# Patient Record
Sex: Male | Born: 1955 | Race: White | Hispanic: No | Marital: Married | Smoking: Former smoker
Health system: Southern US, Community
[De-identification: ages and names within clinical notes are randomized; demographics above are authoritative.]

## PROBLEM LIST (undated history)

## (undated) DIAGNOSIS — G629 Polyneuropathy, unspecified: Secondary | ICD-10-CM

## (undated) DIAGNOSIS — R221 Localized swelling, mass and lump, neck: Secondary | ICD-10-CM

## (undated) DIAGNOSIS — K760 Fatty (change of) liver, not elsewhere classified: Secondary | ICD-10-CM

## (undated) DIAGNOSIS — I499 Cardiac arrhythmia, unspecified: Secondary | ICD-10-CM

## (undated) DIAGNOSIS — R600 Localized edema: Secondary | ICD-10-CM

## (undated) HISTORY — PX: HERNIA REPAIR: SHX51

## (undated) HISTORY — PX: SALIVARY GLAND SURGERY: SHX768

## (undated) HISTORY — PX: A FLUTTER ABLATION: SHX5348

## (undated) HISTORY — PX: CARDIAC CATHETERIZATION: SHX172

## (undated) HISTORY — PX: TONSILLECTOMY: SUR1361

## (undated) HISTORY — PX: HEMORROIDECTOMY: SUR656

---

## 2014-06-15 ENCOUNTER — Inpatient Hospital Stay: Payer: Self-pay | Admitting: Family Medicine

## 2014-06-15 LAB — PRO B NATRIURETIC PEPTIDE: B-TYPE NATIURETIC PEPTID: 4251 pg/mL — AB (ref 0–125)

## 2014-06-15 LAB — URINALYSIS, COMPLETE
Bacteria: NONE SEEN
Bilirubin,UR: NEGATIVE
Blood: NEGATIVE
GLUCOSE, UR: NEGATIVE mg/dL (ref 0–75)
KETONE: NEGATIVE
LEUKOCYTE ESTERASE: NEGATIVE
Nitrite: NEGATIVE
PH: 6 (ref 4.5–8.0)
Protein: NEGATIVE
RBC,UR: <1 /HPF (ref 0–5)
Specific Gravity: 1.015 (ref 1.003–1.030)
Squamous Epithelial: NONE SEEN

## 2014-06-15 LAB — CK TOTAL AND CKMB (NOT AT ARMC)
CK, TOTAL: 117 U/L
CK, TOTAL: 95 U/L
CK, Total: 100 U/L
CK-MB: 0.9 ng/mL (ref 0.5–3.6)
CK-MB: 0.9 ng/mL (ref 0.5–3.6)
CK-MB: 0.8 ng/mL (ref 0.5–3.6)

## 2014-06-15 LAB — HEMOGLOBIN A1C: HEMOGLOBIN A1C: 6.2 % (ref 4.2–6.3)

## 2014-06-15 LAB — COMPREHENSIVE METABOLIC PANEL
ANION GAP: 11 (ref 7–16)
Albumin: 3.8 g/dL (ref 3.4–5.0)
Alkaline Phosphatase: 111 U/L
BUN: 12 mg/dL (ref 7–18)
Bilirubin,Total: 0.7 mg/dL (ref 0.2–1.0)
CHLORIDE: 104 mmol/L (ref 98–107)
CREATININE: 1.3 mg/dL (ref 0.60–1.30)
Calcium, Total: 8.6 mg/dL (ref 8.5–10.1)
Co2: 23 mmol/L (ref 21–32)
EGFR (African American): >60
EGFR (Non-African Amer.): 60 — ABNORMAL LOW
Glucose: 179 mg/dL — ABNORMAL HIGH (ref 65–99)
OSMOLALITY: 280 (ref 275–301)
POTASSIUM: 3.3 mmol/L — AB (ref 3.5–5.1)
SGOT(AST): 30 U/L (ref 15–37)
SGPT (ALT): 33 U/L (ref 12–78)
SODIUM: 138 mmol/L (ref 136–145)
Total Protein: 7.6 g/dL (ref 6.4–8.2)

## 2014-06-15 LAB — CBC
HCT: 49.2 % (ref 40.0–52.0)
HGB: 16.5 g/dL (ref 13.0–18.0)
MCH: 31.2 pg (ref 26.0–34.0)
MCHC: 33.5 g/dL (ref 32.0–36.0)
MCV: 93 fL (ref 80–100)
Platelet: 233 10*3/uL (ref 150–440)
RBC: 5.29 10*6/uL (ref 4.40–5.90)
RDW: 14.2 % (ref 11.5–14.5)
WBC: 10.1 10*3/uL (ref 3.8–10.6)

## 2014-06-15 LAB — TROPONIN I
TROPONIN-I: 0.03 ng/mL
Troponin-I: 0.03 ng/mL
Troponin-I: 0.02 ng/mL

## 2014-06-16 LAB — BASIC METABOLIC PANEL
ANION GAP: 11 (ref 7–16)
BUN: 16 mg/dL (ref 7–18)
CHLORIDE: 103 mmol/L (ref 98–107)
Calcium, Total: 8.6 mg/dL (ref 8.5–10.1)
Co2: 26 mmol/L (ref 21–32)
Creatinine: 1.19 mg/dL (ref 0.60–1.30)
EGFR (Non-African Amer.): >60
GLUCOSE: 119 mg/dL — AB (ref 65–99)
Osmolality: 282 (ref 275–301)
POTASSIUM: 3.3 mmol/L — AB (ref 3.5–5.1)
Sodium: 140 mmol/L (ref 136–145)

## 2014-06-16 LAB — CBC
HCT: 42.3 % (ref 40.0–52.0)
HGB: 14 g/dL (ref 13.0–18.0)
MCH: 30.7 pg (ref 26.0–34.0)
MCHC: 33 g/dL (ref 32.0–36.0)
MCV: 93 fL (ref 80–100)
PLATELETS: 220 10*3/uL (ref 150–440)
RBC: 4.55 10*6/uL (ref 4.40–5.90)
RDW: 14 % (ref 11.5–14.5)
WBC: 6.8 10*3/uL (ref 3.8–10.6)

## 2014-06-16 LAB — PROTIME-INR
INR: 1
Prothrombin Time: 13.4 secs (ref 11.5–14.7)

## 2014-06-16 LAB — TSH: THYROID STIMULATING HORM: 4.07 u[IU]/mL

## 2014-06-17 LAB — BASIC METABOLIC PANEL
Anion Gap: 6 — ABNORMAL LOW (ref 7–16)
BUN: 16 mg/dL (ref 7–18)
CALCIUM: 8.6 mg/dL (ref 8.5–10.1)
CREATININE: 1.01 mg/dL (ref 0.60–1.30)
Chloride: 104 mmol/L (ref 98–107)
Co2: 30 mmol/L (ref 21–32)
EGFR (Non-African Amer.): >60
Glucose: 104 mg/dL — ABNORMAL HIGH (ref 65–99)
OSMOLALITY: 281 (ref 275–301)
Potassium: 3.8 mmol/L (ref 3.5–5.1)
Sodium: 140 mmol/L (ref 136–145)

## 2015-03-20 NOTE — Consult Note (Signed)
PATIENT NAME:  Mike Vasquez, Mike Vasquez MR#:  [id number] DATE OF BIRTH:  Apr 03, 1956  DATE OF CONSULTATION:  06/15/2014  CONSULTING PHYSICIAN:  Dionisio David, MD  INDICATION FOR CONSULTATION: Chest pain.   This is a 59 year old white male with a past medical history of hypertension, hyperlipidemia, who does not reside in this area, presented with chest pain and was found to be in atrial flutter with rapid ventricular response rate. I was asked to evaluate the patient. The patient denies shortness of breath, but is complaining of still tightness in the chest. Monitor shows atrial flutter.   PAST MEDICAL HISTORY: As mentioned, hyperlipidemia, hypertension.   SOCIAL HISTORY: Quit smoking 1 year ago.   FAMILY HISTORY: Positive for coronary artery disease.   PHYSICAL EXAMINATION: GENERAL: He is alert, oriented x 3, in no acute distress.  VITAL SIGNS: Blood pressure is 149/100, respirations 18. Pulse is 110. Monitor shows atrial flutter.  NECK: Revealed no JVD.  LUNGS: Clear.  HEART: Irregularly irregular pulse. Normal S1, S2. No audible murmur.  ABDOMEN: Soft, nontender, positive bowel sounds.  EXTREMITIES: No pedal edema.   EKG shows atrial flutter, 2:1 block, 141 per minute, nonspecific ST-T changes.   First set of cardiac enzymes is negative. Rest of the labs are unremarkable. BNP is high, 4251.   ASSESSMENT AND PLAN: Atrial flutter. Advised starting the patient, instead of Cardizem, on amiodarone drip. Also advised anticoagulating the patient and do an echocardiogram and a stress test.   Thank you very much for the referral.   ____________________________ Dionisio David, MD sak:dmm D: 06/15/2014 13:06:46 ET T: 06/15/2014 13:33:30 ET JOB#: [id number]  cc: Dionisio David, MD, <Dictator> Dionisio David MD ELECTRONICALLY SIGNED 07/02/2014 12:02

## 2015-03-20 NOTE — H&P (Signed)
PATIENT NAME:  Mike Vasquez, Mike Vasquez MR#:  [id number] DATE OF BIRTH:  04-Dec-1955  DATE OF ADMISSION:  06/15/2014  PRIMARY CARE PHYSICIAN: Nonfocal.   CHIEF COMPLAINT: Palpitations and low blood pressure.   HISTORY OF PRESENT ILLNESS:  A 58 year old male who says he is feeling bad since yesterday. He took his blood pressure yesterday, it was 87/87 which is very odd for him, pulse is 144. He thought his symptoms would improve throughout the day but they did not.  His pulse remains above 100 so he came here to the ER for further evaluation. In the ER, he was noted to have A. flutter, 2 to 1, with a heart rate in 140s. He was given 30 IV diltiazem total as well as 60 p.o. His heart rate did tolerate this; however, now it is back up in 140s. The patient has had some lower extremity edema and abdominal distention. As an outpatient in [place], where he is from, he was actually scheduled to get a stress test this Friday. He had an echocardiogram about a month and a half ago which he says was normal. His cardiologist has been trying to figure out why the patient has lower extremity edema and abdominal distention. He did have an outpatient sleep study evaluation but he does not have those results. He is also complaining of chest tightness radiating to the jaw.   REVIEW OF SYSTEMS:   CONSTITUTIONAL:  No fever or chills. Positive fatigue, weakness.  EYES: No blurred or double vision, glaucoma or cataracts.   HEENT:  No ear pain, hearing loss.  Positive snoring. No allergies or postnasal drip.   RESPIRATORY:  No cough, wheeze, hemoptysis, dyspnea, chronic obstructive pulmonary disease.  CARDIOVASCULAR:  Positive palpitations. Positive chest pain and chest tightness. No orthopnea. Positive lower extremity edema. No history of arrhythmia or dyspnea on exertion.  GASTROINTESTINAL: No nausea, vomiting, diarrhea. Positive abdominal distention, no melena or ulcers.  GENITOURINARY:  No dysuria or hematuria. ENDOCRINE:  No  polyuria or polydipsia. HEMATOLOGY AND LYMPHATIC:  No anemia or easy bruising SKIN:  No rash or lesions.  MUSCULOSKELETAL: No limitation of activity, no arthritis.   NEUROLOGIC: No history of cerebrovascular accident, transient ischemic attack or seizures. PSYCHIATRIC:  No history of anxiety or depression.   PAST MEDICAL HISTORY: 1.  Hypertension.  2.  Lower extremity edema and abdominal distention.   MEDICATIONS: 1.  Potassium 10 mEq daily.  2.  Omeprazole 20 mg daily.  3.  Metoprolol 25 mg daily.  4.  Bumex 1 mg t.i.d.  5.  Albuterol ipratropium q.6 hours.   ALLERGIES: No known drug allergies.   SOCIAL HISTORY:  No tobacco, very occasional alcohol. No IV drug use.   FAMILY HISTORY:  Positive for CVA and diabetes.   SURGICAL HISTORY:  Hernia repair and submaxillary abstraction.   PHYSICAL EXAMINATION: VITAL SIGNS:  Temperature 98.9, pulse 101, respirations 19, blood pressure 103/77, 96% on room air.  GENERAL: The patient is alert and oriented, in no acute distress.   HEENT:  Head is atraumatic.  Sclerae anicteric. Mucous membranes are moist. Oropharynx clear.   NECK: Supple without JVD, carotid bruits or enlarged thyroid.  CARDIOVASCULAR: Irregularly irregular without any murmurs, gallops or rubs. PMI is hard to palpate due to body habitus.  ABDOMEN: Bowel sounds are positive, distended abdomen without any rebound or guarding. Hard to appreciate organomegaly due to body habitus.  LUNGS: Clear to auscultation without crackles, rhonchi or wheezing. Normal percussion. Normal chest expansion.  EXTREMITIES: Has 2+  edema, right is greater than the left.  NEUROLOGICAL:  Cranial nerves II through XII intact. There are no focal deficits.  SKIN: Without rash or lesions.   LABORATORY, DIAGNOSTIC AND RADIOLOGICAL DATA:   1.  Sodium 138, potassium 3.3, chloride 104, bicarbonate 23, BUN 12, creatinine 1.30, glucose 179, bilirubin 0.7, alkaline phosphatase 111, ALT 33, AST 30, total protein  7.6, albumin 3.8, calcium 8.6.  2.  BNP 1451.   3.  White blood cells 10.1, hemoglobin 16.5, hematocrit 50, platelets 233.  4.  Troponin 0.03, CK 117, CPK MB 3.9.  5.  EKG atrial flutter with a 2 to 1 AV block.  6.  Chest x-ray shows no cardiopulmonary disease.   ASSESSMENT AND PLAN: A 59 year old male who is visiting from [place], who noticed yesterday he felt very bad. He had low blood pressure and his pulse was 144.  He presented today with similar symptoms along with chest tightness radiating to his jaw, found to have atrial flutter with a 2 to 1 atrioventricular block.  1.  Atrial flutter, 2 to 1 atrioventricular block which seems to be new for the patient.  The patient  does complain of chronic fatigue for a year so he may have had these symptoms ongoing for about a year. He is currently being seen by a cardiologist back in [place], is being worked up for lower extremity edema but no mention of atrial flutter. Apparently, he had an echocardiogram about a month and a half ago which he reports as normal. We do not have these records at this time so for this hospitalization the patient will need to be admitted to Critical Care Unit/stepdown as his heart rates are still in 140s and he is on diltiazem drip. His CHADSVASC score is 1 for hypertension so he has gotten aspirin 325 mg daily. I will ask cardiology, Dr. Humphrey Rolls, who is on call today, to see the patient in consultation. I have also started low-dose metoprolol to try to get him off the diltiazem drip. I will check a TSH as well as an echocardiogram as I do not suspect that we will be getting records within the next 24 hours but we have ordered for them.  2.  Lower extremity edema. This is being worked up currently as an outpatient. His right lower extremity is actually greater than left. I am going to order a Doppler to make sure he does not have a deep vein thrombosis causing the swelling and, if this is positive for deep vein thrombosis, potentially he  could even have a blood clot in his lungs causing the atrial flutter. Will also order an echocardiogram, TSH and a urinalysis to make sure he does not have any symptoms of nephrotic syndrome. I suspect that he may have underlying sleep apnea. He did have a sleep apnea test but we do not have results back.  Can empirically put him a continuous positive airway pressure machine here at night to see if that helps. It does appear that the patient is on Bumex 1 mg p.o. t.i.d., not for congestive heart failure. He says he does not have a history of congestive heart failure, but for his lower extremity edema.  I will continue this.  3.  Chest tightness with radiation to the jaw. We will go ahead and schedule a Myoview for the a.m. provided his troponins x 3 were negative. He was supposed to have an outpatient stress test this Friday anyway. I think the patient should have one while  he is in the hospital as he needs travel back to [place] and he has these symptoms which could be indicative of acute coronary syndrome.  4.  History of hypertension. Will use diltiazem and metoprolol as I said above.  5.  Elevated blood sugar. Will check a hemoglobin A1c to rule out diabetes.  6.  Hypokalemia. We will replete.  7.  CODE STATUS: The patient is full CODE STATUS.   TIME SPENT: Approximately 45 minutes.    ____________________________ Donell Beers. Benjie Karvonen, MD spm:cs D: 06/15/2014 12:00:29 ET T: 06/15/2014 15:28:50 ET JOB#: [id number]  cc: Bettejane Leavens P. Benjie Karvonen, MD, <Dictator> Donell Beers Corynn Solberg MD ELECTRONICALLY SIGNED 06/15/2014 17:03

## 2015-03-20 NOTE — Discharge Summary (Signed)
PATIENT NAME:  Mike Vasquez, Mike Vasquez MR#:  [id number] DATE OF BIRTH:  Feb 12, 1956  DATE OF ADMISSION:  06/15/2014 DATE OF DISCHARGE:  06/17/2014  PROCEDURES:  Cardiac catheterization done by Dr. Neoma Laming showing coronary artery disease, 40% mid-LAD and 30% ostial RCA with normal left ventricular ejection fraction. A 2-D echocardiogram showed left ventricular ejection fraction estimation of 40-45%. Apex, entire inferior wall, mid and apical anterior wall were abnormal.  Severely dilated left atrium, mildly dilated right atrium, mitral valve regurgitation, severe increase of left ventricular posterior wall thickness, mild tricuspid regurgitation.   Please note that this echocardiogram was done while the patient was still in atrial flutter.  Dr. Humphrey Rolls recommended that the echocardiogram be repeated within the next 30 days.   DISCHARGE DIAGNOSES:  1.  Atrial flutter.  2.  Coronary artery disease, medical management.  3.  Possible sleep apnea.  4.  Left ventricle dysfunction, again likely this is secondary to the atrial flutter, although the patient is going to be discharged on lisinopril with recommendations as far as possible congestive heart failure.   MEDICATIONS AT DISCHARGE: Bumetanide 1 mg 2 times a day, albuterol with ipratropium nebulizer solution at home, omeprazole 20 mg once daily, potassium citrate 10 mEq once a day, Xarelto 20 mg once a day, amiodarone 20 mg once a day, Percocet 7.5 mg/3.25 mg 4 times daily, lisinopril 5 mg once daily.   FOLLOWUP: With Dr. Neoma Laming on Friday. Followup with cardiology in [place] and with his PCP in [place], as well, in the next couple of weeks.   Need to follow up on results of sleep study and possibly getting CPAP. Recommendations were explained to the patient about driving and not doing prolonged trips within the next 24 hours.  If he needs to drive back to [place]  make sure not to drive more than 60 minutes at a time, make frequent stops, at least every couple of  hours.   HOSPITAL COURSE: The patient is a very nice a 59 year old gentleman who has a history of hypertension and chronic lower extremity edema.  The came to the Emergency Department on 06/15/2014 with a chief complaint of palpitations and low blood pressure. The patient was feeling very bad since the day prior to admission.  He took his blood pressure which was 87/57. His pulse was up to 144. Since the symptoms did not improve the patient went to the Emergency Department and in the Emergency Department, he was evaluated. He was found to be in atrial flutter with 2:1 heart blockage. The patient received Cardizem without any significant resolution of his problem. He was admitted to the critical care unit and received a diltiazem drip.   The patient was evaluated by Dr. Neoma Laming who put him on low dose metoprolol, started him on Xarelto and planned for cardioversion. The patient's rhythm transformed overnight and did not require cardioversion at all.  Echocardiogram was performed showing decreased ejection fraction with multiple abnormalities of the wall.  Note that this was done while the patient was still in atrial flutter. Dr. Humphrey Rolls decided to take him to the cardiac catheterization laboratory.  He did a cardiac catheterization that showed minimal coronary artery disease, as mentioned above, and the patient is discharged on Xarelto and lisinopril.   The patient will likely need to go back on aspirin and metoprolol once he is off anticoagulation. As far as his atrial flutter goes, the patient transformed his rhythm. He is going to be discharged on amiodarone 200 mg,  and as per Dr. Humphrey Rolls, he wants to be the patient to be on Xarelto 20 mg only for 1 month until he is able to recheck an echocardiogram and see if the patient really has congestive heart failure or if this was an artifact secondary to the atrial flutter that was going on  acutely. If his ejection fraction improves the patient will be able to go  home with aspirin alone.   At the moment the patient is stable and he is able to be discharged. His heart rate is in the 50s for which we are holding his metoprolol. Since his ejection fraction was decreased we are starting an ACE inhibitor, this also could be stopped if the patient has a better ejection fraction in 1 month. The patient states that he has had ACE inhibitors in the past without any significant side effects or complications.   Education was given about congestive heart failure.   The patient also has high blood pressure which has been well controlled during this hospitalization.   It seems that most of these issues were triggered due to his possible sleep apnea. He was given CPAP here in the hospital and he tolerated it very well, but now he needs to go back to his primary care physician and see the results.   DISPOSITION:  The patient is discharged in good condition.   TIME SPENT: I spent about 45 minutes discharging this patient.   FOLLOWUP:  Dr. Neoma Laming and his cardiologist in [place].    ____________________________ [place] Sink, MD rsg:lt D: 06/17/2014 10:33:48 ET T: 06/17/2014 11:58:25 ET JOB#: [id number]  cc: [place] Sink, MD, <Dictator> Treson Laura America Brown MD ELECTRONICALLY SIGNED 06/23/2014 0:27

## 2015-03-20 NOTE — Discharge Summary (Signed)
PATIENT NAME:  Mike Vasquez, Mike Vasquez MR#:  [id number] DATE OF BIRTH:  1956-09-22  DATE OF ADMISSION:  06/15/2014 DATE OF DISCHARGE:    ADDENDUM:  Change of medications on discharge summary. Actually the lisinopril is going to be changed to Cozaar 25 mg daily. The patient actually remember that at some point with lisinopril, he started  coughing and coughing, and he was taken off it.  We are just going to change it to Cozaar.   ____________________________ [place] Sink, MD rsg:ts D: 06/17/2014 10:38:08 ET T: 06/17/2014 12:14:33 ET JOB#: [id number]  cc: [place] Sink, MD, <Dictator> Tannar Broker America Brown MD ELECTRONICALLY SIGNED 06/23/2014 0:27

## 2016-01-16 IMAGING — US US EXTREM LOW VENOUS*R*
1 series · 13 of 24 positions shown · non-contrast
Comparison: None.

CLINICAL DATA: Right leg swelling



[Series 1: us extrem low venous*right* · 0.08mm/px · 13 of 34 slices shown]
[im 1/34]
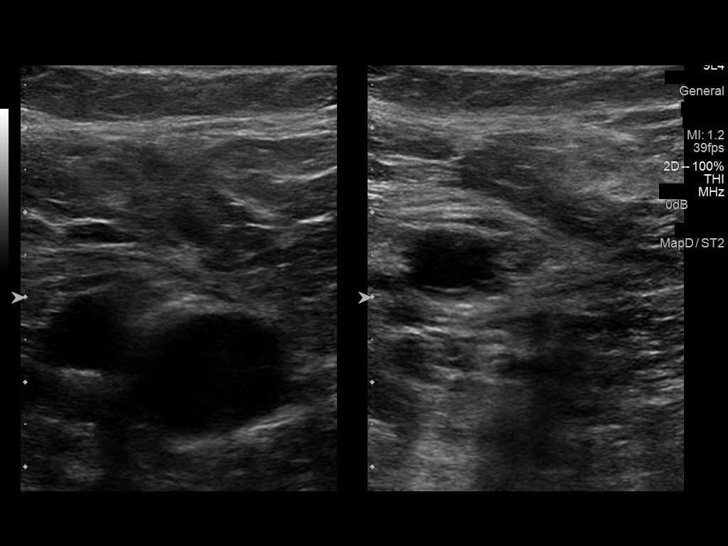
[im 3/34]
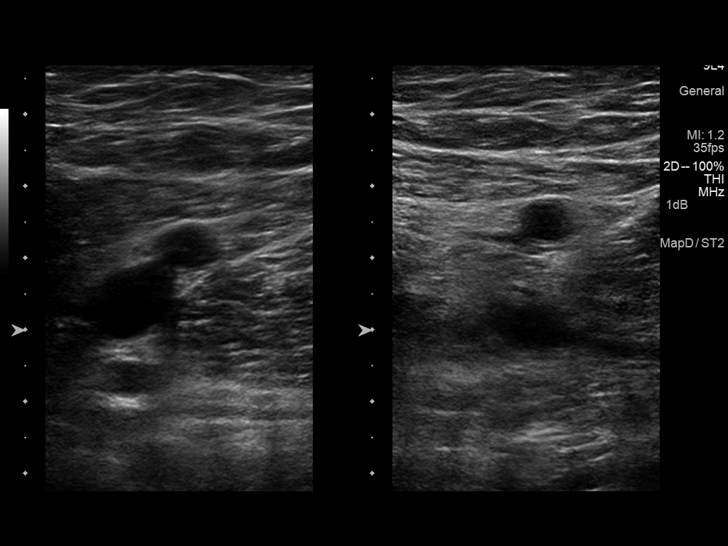
[im 6/34]
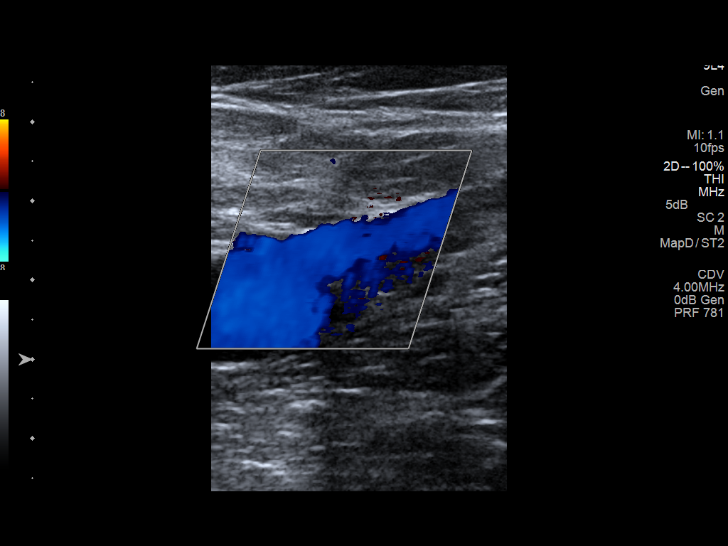
[im 9/34]
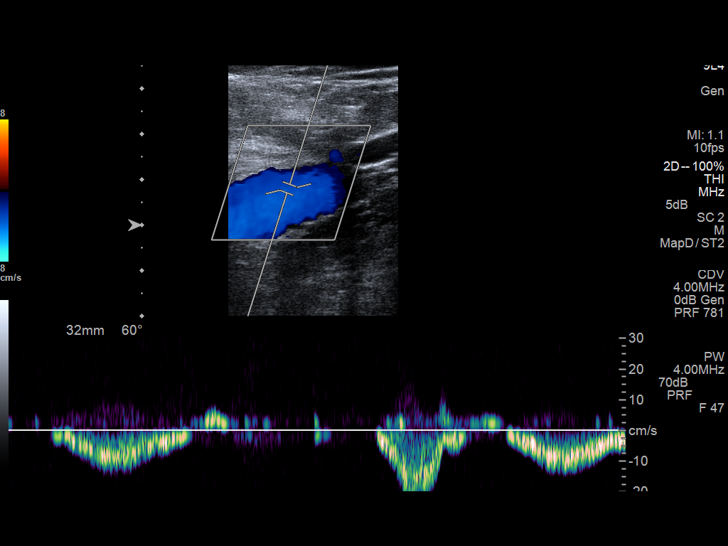
[im 12/34]
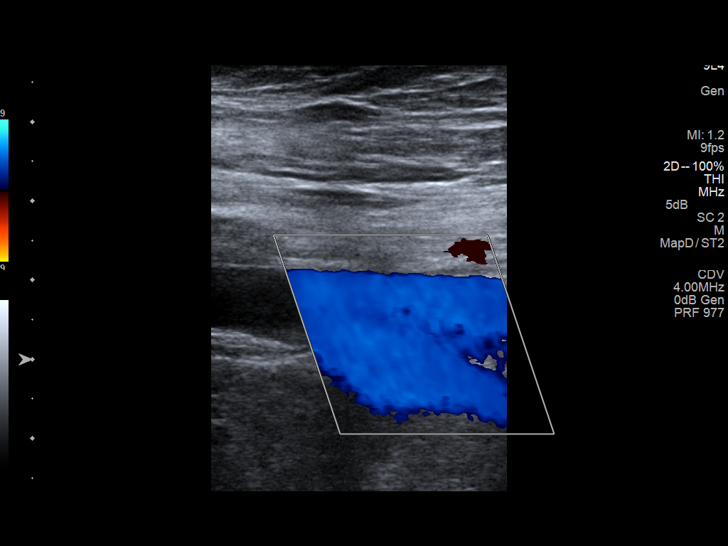
[im 15/34]
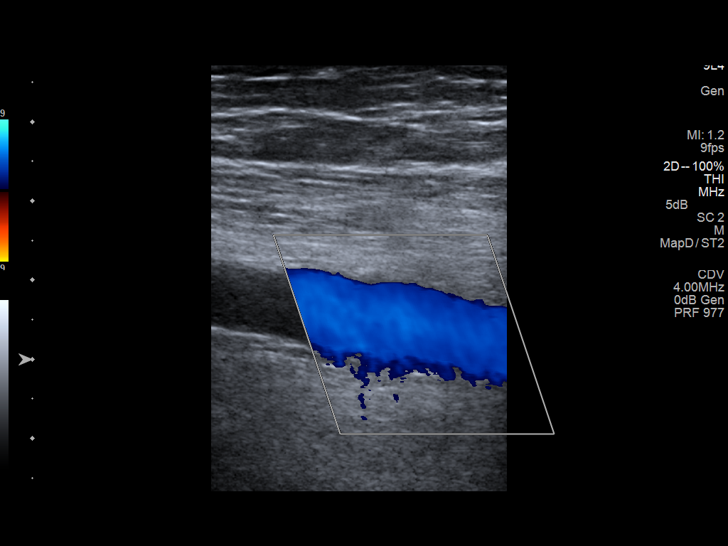
[im 18/34]
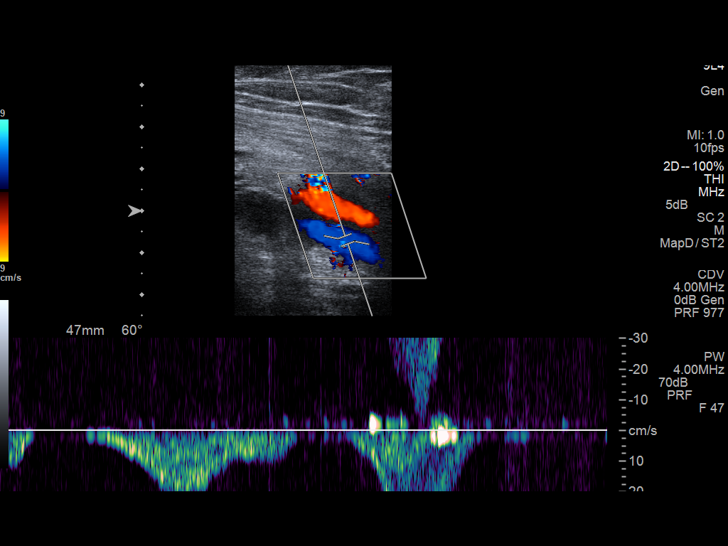
[im 19/34]
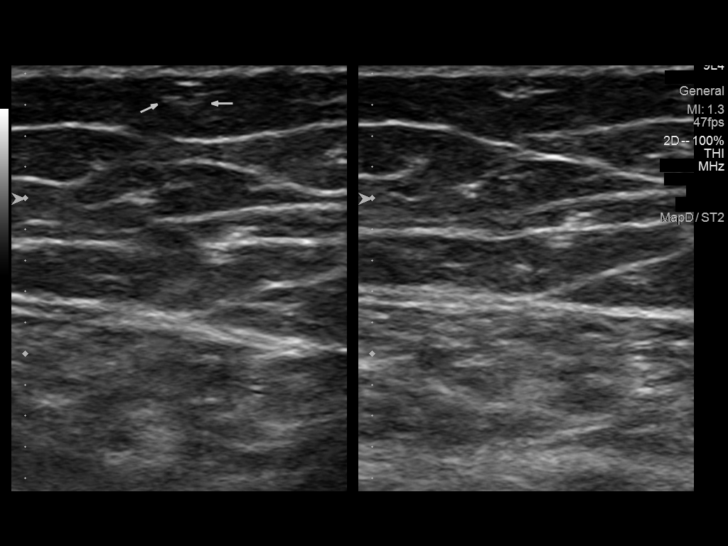
[im 22/34]
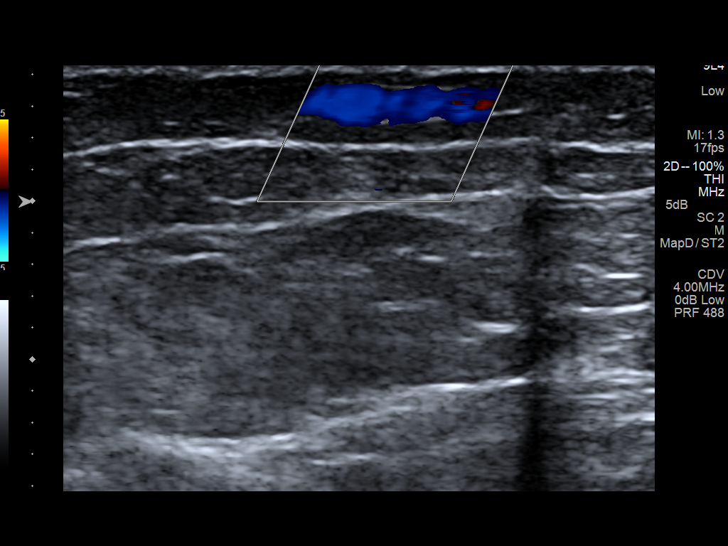
[im 25/34]
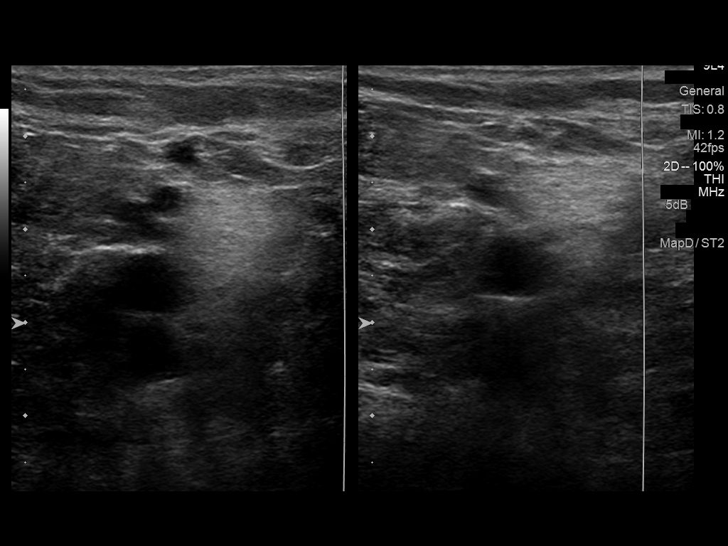
[im 28/34]
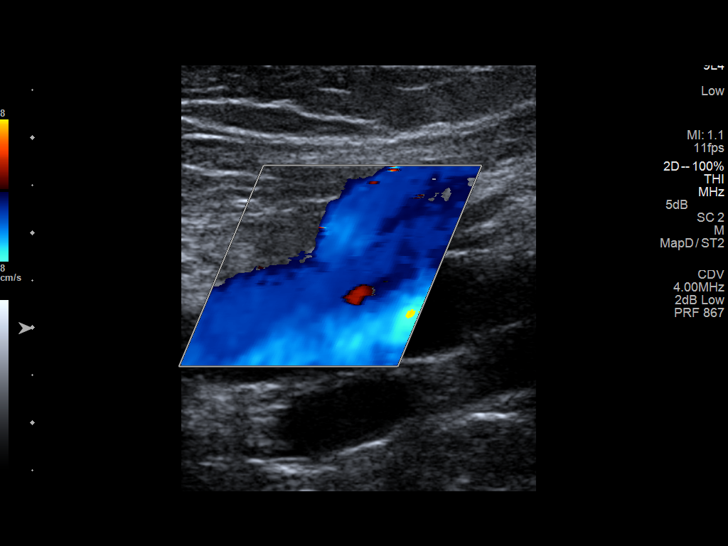
[im 31/34]
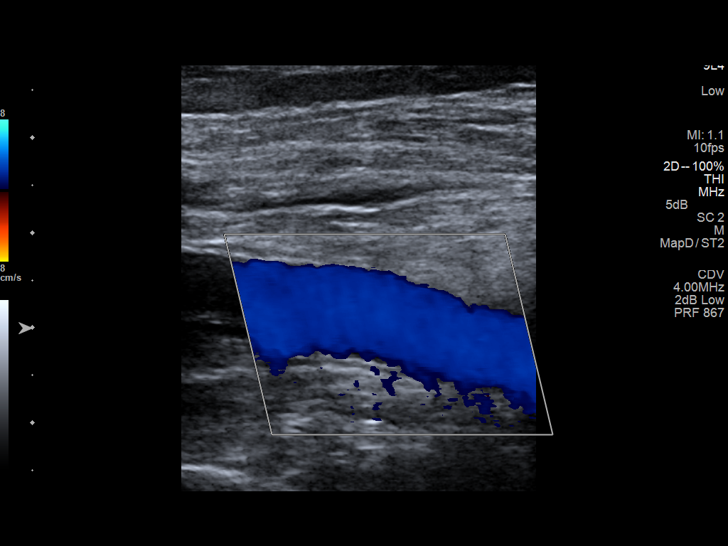
[im 34/34]
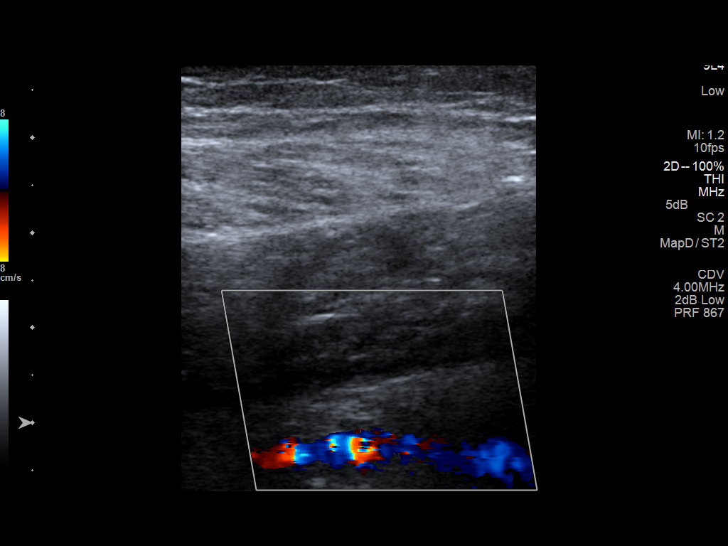

[13 of 24 positions shown; findings below may reference images not displayed]

FINDINGS: Common Femoral Vein: No evidence of thrombus. Normal
compressibility, respiratory phasicity and response to augmentation.

Saphenofemoral Junction: No evidence of thrombus. Normal
compressibility and flow on color Doppler imaging.

Profunda Femoral Vein: No evidence of thrombus. Normal
compressibility and flow on color Doppler imaging.

Femoral Vein: No evidence of thrombus. Normal compressibility,
respiratory phasicity and response to augmentation.

Popliteal Vein: No evidence of thrombus. Normal compressibility,
respiratory phasicity and response to augmentation.

Calf Veins: No evidence of thrombus. Normal compressibility and flow
on color Doppler imaging.

Superficial Great Saphenous Vein: No evidence of thrombus. Normal
compressibility and flow on color Doppler imaging.

Venous Reflux:  None.

Other Findings:  None.
IMPRESSION: No evidence of deep venous thrombosis.

## 2019-10-28 ENCOUNTER — Other Ambulatory Visit: Payer: Self-pay

## 2019-10-28 ENCOUNTER — Encounter
Admission: RE | Admit: 2019-10-28 | Discharge: 2019-10-28 | Disposition: A | Discharge status: Home / Self Care | Payer: BC Managed Care – PPO | Source: Ambulatory Visit | Attending: Otolaryngology | Admitting: Otolaryngology

## 2019-10-28 DIAGNOSIS — Z01818 Encounter for other preprocedural examination: Secondary | ICD-10-CM | POA: Insufficient documentation

## 2019-10-28 DIAGNOSIS — I4892 Unspecified atrial flutter: Secondary | ICD-10-CM | POA: Insufficient documentation

## 2019-10-28 DIAGNOSIS — Z20828 Contact with and (suspected) exposure to other viral communicable diseases: Secondary | ICD-10-CM | POA: Insufficient documentation

## 2019-10-28 DIAGNOSIS — R001 Bradycardia, unspecified: Secondary | ICD-10-CM | POA: Insufficient documentation

## 2019-10-28 DIAGNOSIS — D440 Neoplasm of uncertain behavior of thyroid gland: Secondary | ICD-10-CM | POA: Insufficient documentation

## 2019-10-28 DIAGNOSIS — Z01810 Encounter for preprocedural cardiovascular examination: Secondary | ICD-10-CM

## 2019-10-28 HISTORY — DX: Cardiac arrhythmia, unspecified: I49.9

## 2019-10-28 HISTORY — DX: Polyneuropathy, unspecified: G62.9

## 2019-10-28 HISTORY — DX: Fatty (change of) liver, not elsewhere classified: K76.0

## 2019-10-28 HISTORY — DX: Localized swelling, mass and lump, neck: R22.1

## 2019-10-28 HISTORY — DX: Localized edema: R60.0

## 2019-10-28 LAB — CBC
HCT: 49.7 % (ref 39.0–52.0)
Hemoglobin: 16.7 g/dL (ref 13.0–17.0)
MCH: 31 pg (ref 26.0–34.0)
MCHC: 33.6 g/dL (ref 30.0–36.0)
MCV: 92.4 fL (ref 80.0–100.0)
Platelets: 221 10*3/uL (ref 150–400)
RBC: 5.38 MIL/uL (ref 4.22–5.81)
RDW: 13.3 % (ref 11.5–15.5)
WBC: 7.6 10*3/uL (ref 4.0–10.5)
nRBC: 0 % (ref 0.0–0.2)

## 2019-10-28 LAB — BASIC METABOLIC PANEL
Anion gap: 9 (ref 5–15)
BUN: 22 mg/dL (ref 8–23)
CO2: 24 mmol/L (ref 22–32)
Calcium: 9.3 mg/dL (ref 8.9–10.3)
Chloride: 107 mmol/L (ref 98–111)
Creatinine, Ser: 1.14 mg/dL (ref 0.61–1.24)
GFR calc Af Amer: >60 mL/min (ref >60)
GFR calc non Af Amer: >60 mL/min (ref >60)
Glucose, Bld: 95 mg/dL (ref 70–99)
Potassium: 3.8 mmol/L (ref 3.5–5.1)
Sodium: 140 mmol/L (ref 135–145)

## 2019-10-28 NOTE — Pre-Procedure Instructions (Signed)
Called anesthesia regarding medical history and abnormal EKG.  Cardiac clearance requested.  Dr Maisie Fus office notified and faxed a request to their office.

## 2019-10-28 NOTE — Patient Instructions (Signed)
Your procedure is scheduled on: 10/31/2019 Fri Report to Same Day Surgery 2nd floor medical mall [facility] Entrance-take elevator on left to 2nd floor.  Check in with surgery information desk.) To find out your arrival time please call [phone] between 1PM - 3PM on 10/30/2019 Thurs  Remember: Instructions that are not followed completely may result in serious medical risk, up to and including death, or upon the discretion of your surgeon and anesthesiologist your surgery may need to be rescheduled.    _x___ 1. Do not eat food after midnight the night before your procedure. You may drink clear liquids up to 2 hours before you are scheduled to arrive at the hospital for your procedure.  Do not drink clear liquids within 2 hours of your scheduled arrival to the hospital.  Clear liquids include  --Water or Apple juice without pulp  --Clear carbohydrate beverage such as ClearFast or Gatorade  --Black Coffee or Clear Tea (No milk, no creamers, do not add anything to                  the coffee or Tea Type 1 and type 2 diabetics should only drink water.   ____Ensure clear carbohydrate drink on the way to the hospital for bariatric patients  ____Ensure clear carbohydrate drink 3 hours before surgery.   No gum chewing or hard candies.     __x__ 2. No Alcohol for 24 hours before or after surgery.   __x__3. No Smoking or e-cigarettes for 24 prior to surgery.  Do not use any chewable tobacco products for at least 6 hour prior to surgery   ____  4. Bring all medications with you on the day of surgery if instructed.    __x__ 5. Notify your doctor if there is any change in your medical condition     (cold, fever, infections).    x___6. On the morning of surgery brush your teeth with toothpaste and water.  You may rinse your mouth with mouth wash if you wish.  Do not swallow any toothpaste or mouthwash.   Do not wear jewelry, make-up, hairpins, clips or nail polish.  Do not wear lotions,  powders, or perfumes. You may wear deodorant.  Do not shave 48 hours prior to surgery. Men may shave face and neck.  Do not bring valuables to the hospital.    [facility] is not responsible for any belongings or valuables.               Contacts, dentures or bridgework may not be worn into surgery.  Leave your suitcase in the car. After surgery it may be brought to your room.  For patients admitted to the hospital, discharge time is determined by your                       treatment team.  _  Patients discharged the day of surgery will not be allowed to drive home.  You will need someone to drive you home and stay with you the night of your procedure.    Please read over the following fact sheets that you were given:   [facility] Preparing for Surgery and or MRSA Information   _x___ Take anti-hypertensive listed below, cardiac, seizure, asthma,     anti-reflux and psychiatric medicines. These include:  1. fenofibrate 160 MG tablet  2.gabapentin (NEURONTIN) 300 MG capsule  3.  4.  5.  6.  ____Fleets enema or Magnesium Citrate as directed.  _x___ Use CHG Soap or sage wipes as directed on instruction sheet   ____ Use inhalers on the day of surgery and bring to hospital day of surgery  ____ Stop Metformin and Janumet 2 days prior to surgery.    ____ Take 1/2 of usual insulin dose the night before surgery and none on the morning     surgery.   _x___ Follow recommendations from Cardiologist, Pulmonologist or PCP regarding          stopping Aspirin, Coumadin, Plavix ,Eliquis, Effient, or Pradaxa, and Pletal.  X____Stop Anti-inflammatories such as Advil, Aleve, Ibuprofen, Motrin, Naproxen, Naprosyn, Goodies powders or aspirin products. OK to take Tylenol and                          Celebrex.   _x___ Stop supplements until after surgery.  But may continue Vitamin D, Vitamin B,       and multivitamin.   ____ Bring C-Pap to the hospital.

## 2019-10-29 LAB — SARS CORONAVIRUS 2 (TAT 6-24 HRS): SARS Coronavirus 2: NEGATIVE

## 2019-10-31 ENCOUNTER — Other Ambulatory Visit: Payer: Self-pay

## 2019-10-31 ENCOUNTER — Observation Stay
Admission: RE | Admit: 2019-10-31 | Discharge: 2019-11-01 | Disposition: A | Discharge status: Home / Self Care | Payer: BC Managed Care – PPO | Attending: Otolaryngology | Admitting: Otolaryngology

## 2019-10-31 ENCOUNTER — Ambulatory Visit: Payer: BC Managed Care – PPO | Admitting: Certified Registered"

## 2019-10-31 ENCOUNTER — Encounter
Admission: RE | Disposition: A | Discharge status: Home / Self Care | Payer: Self-pay | Source: Home / Self Care | Attending: Otolaryngology

## 2019-10-31 DIAGNOSIS — Z87891 Personal history of nicotine dependence: Secondary | ICD-10-CM | POA: Insufficient documentation

## 2019-10-31 DIAGNOSIS — Z888 Allergy status to other drugs, medicaments and biological substances status: Secondary | ICD-10-CM | POA: Insufficient documentation

## 2019-10-31 DIAGNOSIS — E890 Postprocedural hypothyroidism: Secondary | ICD-10-CM

## 2019-10-31 DIAGNOSIS — I509 Heart failure, unspecified: Secondary | ICD-10-CM | POA: Diagnosis not present

## 2019-10-31 DIAGNOSIS — I495 Sick sinus syndrome: Secondary | ICD-10-CM | POA: Insufficient documentation

## 2019-10-31 DIAGNOSIS — C73 Malignant neoplasm of thyroid gland: Principal | ICD-10-CM | POA: Insufficient documentation

## 2019-10-31 DIAGNOSIS — Z79899 Other long term (current) drug therapy: Secondary | ICD-10-CM | POA: Diagnosis not present

## 2019-10-31 DIAGNOSIS — E785 Hyperlipidemia, unspecified: Secondary | ICD-10-CM | POA: Insufficient documentation

## 2019-10-31 DIAGNOSIS — G629 Polyneuropathy, unspecified: Secondary | ICD-10-CM | POA: Insufficient documentation

## 2019-10-31 DIAGNOSIS — Z791 Long term (current) use of non-steroidal anti-inflammatories (NSAID): Secondary | ICD-10-CM | POA: Diagnosis not present

## 2019-10-31 DIAGNOSIS — E041 Nontoxic single thyroid nodule: Secondary | ICD-10-CM | POA: Diagnosis present

## 2019-10-31 DIAGNOSIS — Z9889 Other specified postprocedural states: Secondary | ICD-10-CM

## 2019-10-31 HISTORY — PX: THYROIDECTOMY: SHX17

## 2019-10-31 SURGERY — THYROIDECTOMY
Anesthesia: General | Laterality: Bilateral

## 2019-10-31 MED ORDER — ACETAMINOPHEN 10 MG/ML IV SOLN
INTRAVENOUS | Status: DC | PRN
  Administered 2019-10-31: 1000 mg via INTRAVENOUS

## 2019-10-31 MED ORDER — ACETAMINOPHEN 10 MG/ML IV SOLN
INTRAVENOUS | Status: AC
  Filled 2019-10-31: qty 100

## 2019-10-31 MED ORDER — LIDOCAINE-EPINEPHRINE (PF) 1 %-1:200000 IJ SOLN
INTRAMUSCULAR | Status: DC | PRN
  Administered 2019-10-31: 7 mL

## 2019-10-31 MED ORDER — MORPHINE SULFATE (PF) 4 MG/ML IV SOLN
3.0000 mg | INTRAVENOUS | Status: DC | PRN
  Administered 2019-10-31 (×2): 3 mg via INTRAVENOUS
  Filled 2019-10-31 (×2): qty 1

## 2019-10-31 MED ORDER — HYDROCODONE-ACETAMINOPHEN 5-325 MG PO TABS
1.0000 | ORAL_TABLET | ORAL | Status: DC | PRN
  Administered 2019-10-31: 1 via ORAL
  Administered 2019-10-31: 2 via ORAL
  Administered 2019-10-31: 1 via ORAL
  Administered 2019-11-01 (×2): 2 via ORAL
  Filled 2019-10-31 (×3): qty 2
  Filled 2019-10-31 (×2): qty 1

## 2019-10-31 MED ORDER — FENTANYL CITRATE (PF) 100 MCG/2ML IJ SOLN
INTRAMUSCULAR | Status: AC
  Filled 2019-10-31: qty 2

## 2019-10-31 MED ORDER — BISACODYL 5 MG PO TBEC
5.0000 mg | DELAYED_RELEASE_TABLET | Freq: Every day | ORAL | Status: DC | PRN
  Filled 2019-10-31: qty 1

## 2019-10-31 MED ORDER — FENTANYL CITRATE (PF) 100 MCG/2ML IJ SOLN
INTRAMUSCULAR | Status: DC | PRN
  Administered 2019-10-31 (×4): 50 ug via INTRAVENOUS

## 2019-10-31 MED ORDER — REMIFENTANIL HCL 1 MG IV SOLR
INTRAVENOUS | Status: AC
  Filled 2019-10-31: qty 1000

## 2019-10-31 MED ORDER — MAGNESIUM HYDROXIDE 400 MG/5ML PO SUSP
30.0000 mL | Freq: Every day | ORAL | Status: DC | PRN
  Filled 2019-10-31: qty 30

## 2019-10-31 MED ORDER — LIDOCAINE-EPINEPHRINE 1 %-1:100000 IJ SOLN
INTRAMUSCULAR | Status: AC
  Filled 2019-10-31: qty 1

## 2019-10-31 MED ORDER — PHENYLEPHRINE HCL (PRESSORS) 10 MG/ML IV SOLN
INTRAVENOUS | Status: DC | PRN
  Administered 2019-10-31: 100 ug via INTRAVENOUS

## 2019-10-31 MED ORDER — REMIFENTANIL HCL 1 MG IV SOLR
INTRAVENOUS | Status: DC | PRN
  Administered 2019-10-31: .15 ug/kg/min via INTRAVENOUS
  Administered 2019-10-31: 11:00:00 via INTRAVENOUS

## 2019-10-31 MED ORDER — OXYCODONE HCL 5 MG/5ML PO SOLN
5.0000 mg | Freq: Once | ORAL | Status: AC | PRN
  Administered 2019-10-31: 13:00:00 5 mg via ORAL

## 2019-10-31 MED ORDER — EPHEDRINE SULFATE 50 MG/ML IJ SOLN
INTRAMUSCULAR | Status: DC | PRN
  Administered 2019-10-31: 10 mg via INTRAVENOUS
  Administered 2019-10-31 (×5): 5 mg via INTRAVENOUS
  Administered 2019-10-31: 10 mg via INTRAVENOUS

## 2019-10-31 MED ORDER — DEXAMETHASONE SODIUM PHOSPHATE 10 MG/ML IJ SOLN
INTRAMUSCULAR | Status: DC | PRN
  Administered 2019-10-31: 20 mg via INTRAVENOUS

## 2019-10-31 MED ORDER — FENTANYL CITRATE (PF) 100 MCG/2ML IJ SOLN
INTRAMUSCULAR | Status: AC
  Administered 2019-10-31: 12:00:00 25 ug via INTRAVENOUS
  Filled 2019-10-31: qty 2

## 2019-10-31 MED ORDER — OXYCODONE HCL 5 MG/5ML PO SOLN
ORAL | Status: AC
  Administered 2019-10-31: 13:00:00 5 mg via ORAL
  Filled 2019-10-31: qty 5

## 2019-10-31 MED ORDER — FAMOTIDINE 20 MG PO TABS
20.0000 mg | ORAL_TABLET | Freq: Once | ORAL | Status: AC
  Administered 2019-10-31: 08:00:00 20 mg via ORAL

## 2019-10-31 MED ORDER — GLYCOPYRROLATE 0.2 MG/ML IJ SOLN
INTRAMUSCULAR | Status: DC | PRN
  Administered 2019-10-31 (×2): 0.2 mg via INTRAVENOUS

## 2019-10-31 MED ORDER — FLEET ENEMA 7-19 GM/118ML RE ENEM: 1.0000 | ENEMA | Freq: Once | RECTAL | Status: DC | PRN

## 2019-10-31 MED ORDER — CALCIUM CARBONATE-VITAMIN D 500-200 MG-UNIT PO TABS
1.0000 | ORAL_TABLET | Freq: Three times a day (TID) | ORAL | Status: DC
  Administered 2019-10-31: 1 via ORAL
  Filled 2019-10-31 (×2): qty 1

## 2019-10-31 MED ORDER — OXYCODONE HCL 5 MG PO TABS: 5.0000 mg | ORAL_TABLET | Freq: Once | ORAL | Status: AC | PRN

## 2019-10-31 MED ORDER — PROPOFOL 10 MG/ML IV BOLUS
INTRAVENOUS | Status: DC | PRN
  Administered 2019-10-31: 150 mg via INTRAVENOUS

## 2019-10-31 MED ORDER — LACTATED RINGERS IV SOLN
INTRAVENOUS | Status: DC | PRN
  Administered 2019-10-31 (×2): via INTRAVENOUS

## 2019-10-31 MED ORDER — OXYCODONE HCL 5 MG/5ML PO SOLN
5.0000 mg | Freq: Once | ORAL | Status: AC
  Administered 2019-10-31: 14:00:00 5 mg via ORAL

## 2019-10-31 MED ORDER — MIDAZOLAM HCL 2 MG/2ML IJ SOLN
INTRAMUSCULAR | Status: AC
  Filled 2019-10-31: qty 2

## 2019-10-31 MED ORDER — LACTATED RINGERS IV SOLN
INTRAVENOUS | Status: DC
  Administered 2019-10-31: 20 mL/h via INTRAVENOUS

## 2019-10-31 MED ORDER — MEPERIDINE HCL 50 MG/ML IJ SOLN: 6.2500 mg | INTRAMUSCULAR | Status: DC | PRN

## 2019-10-31 MED ORDER — OXYCODONE HCL 5 MG PO TABS: 5.0000 mg | ORAL_TABLET | Freq: Once | ORAL | Status: DC

## 2019-10-31 MED ORDER — FENTANYL CITRATE (PF) 100 MCG/2ML IJ SOLN
INTRAMUSCULAR | Status: AC
  Administered 2019-10-31: 25 ug via INTRAVENOUS
  Filled 2019-10-31: qty 2

## 2019-10-31 MED ORDER — SUCCINYLCHOLINE CHLORIDE 20 MG/ML IJ SOLN
INTRAMUSCULAR | Status: DC | PRN
  Administered 2019-10-31: 120 mg via INTRAVENOUS

## 2019-10-31 MED ORDER — LIDOCAINE HCL (CARDIAC) PF 100 MG/5ML IV SOSY
PREFILLED_SYRINGE | INTRAVENOUS | Status: DC | PRN
  Administered 2019-10-31: 100 mg via INTRAVENOUS

## 2019-10-31 MED ORDER — OXYCODONE HCL 5 MG/5ML PO SOLN
ORAL | Status: AC
  Administered 2019-10-31: 5 mg via ORAL
  Filled 2019-10-31: qty 5

## 2019-10-31 MED ORDER — DEXTROSE-NACL 5-0.45 % IV SOLN
INTRAVENOUS | Status: DC
  Administered 2019-10-31 – 2019-11-01 (×2): via INTRAVENOUS

## 2019-10-31 MED ORDER — PROMETHAZINE HCL 25 MG/ML IJ SOLN: 6.2500 mg | INTRAMUSCULAR | Status: DC | PRN

## 2019-10-31 MED ORDER — FENTANYL CITRATE (PF) 100 MCG/2ML IJ SOLN
25.0000 ug | INTRAMUSCULAR | Status: AC | PRN
  Administered 2019-10-31 (×6): 25 ug via INTRAVENOUS

## 2019-10-31 MED ORDER — ONDANSETRON HCL 4 MG/2ML IJ SOLN
4.0000 mg | Freq: Four times a day (QID) | INTRAMUSCULAR | Status: DC | PRN
  Administered 2019-10-31: 4 mg via INTRAVENOUS
  Filled 2019-10-31: qty 2

## 2019-10-31 MED ORDER — ONDANSETRON HCL 4 MG/2ML IJ SOLN
INTRAMUSCULAR | Status: DC | PRN
  Administered 2019-10-31 (×2): 4 mg via INTRAVENOUS

## 2019-10-31 MED ORDER — ONDANSETRON 4 MG PO TBDP: 4.0000 mg | ORAL_TABLET | Freq: Four times a day (QID) | ORAL | Status: DC | PRN

## 2019-10-31 MED ORDER — BACITRACIN ZINC 500 UNIT/GM EX OINT
TOPICAL_OINTMENT | CUTANEOUS | Status: AC
  Filled 2019-10-31: qty 28.35

## 2019-10-31 MED ORDER — FAMOTIDINE 20 MG PO TABS
ORAL_TABLET | ORAL | Status: AC
  Administered 2019-10-31: 08:00:00 20 mg via ORAL
  Filled 2019-10-31: qty 1

## 2019-10-31 MED ORDER — MIDAZOLAM HCL 2 MG/2ML IJ SOLN
INTRAMUSCULAR | Status: DC | PRN
  Administered 2019-10-31: 2 mg via INTRAVENOUS

## 2019-10-31 SURGICAL SUPPLY — 38 items
BLADE SURG 15 STRL LF DISP TIS (BLADE) ×2 IMPLANT
BLADE SURG 15 STRL SS (BLADE) ×4
CANISTER SUCT 1200ML W/VALVE (MISCELLANEOUS) ×3 IMPLANT
CORD BIP STRL DISP 12FT (MISCELLANEOUS) ×3 IMPLANT
COVER WAND RF STERILE (DRAPES) ×3 IMPLANT
DRAIN TLS ROUND 10FR (DRAIN) IMPLANT
DRAPE MAG INST 16X20 L/F (DRAPES) ×3 IMPLANT
DRSG TEGADERM 2-3/8X2-3/4 SM (GAUZE/BANDAGES/DRESSINGS) ×3 IMPLANT
DRSG TEGADERM 4X4.75 (GAUZE/BANDAGES/DRESSINGS) ×3 IMPLANT
DRSG TELFA 3X8 NADH (GAUZE/BANDAGES/DRESSINGS) ×3 IMPLANT
ELECT LARYNGEAL 6/7 (MISCELLANEOUS) ×3
ELECT LARYNGEAL 8/9 (MISCELLANEOUS)
ELECT REM PT RETURN 9FT ADLT (ELECTROSURGICAL) ×3
ELECTRODE LARYNGEAL 6/7 (MISCELLANEOUS) ×1 IMPLANT
ELECTRODE LARYNGEAL 8/9 (MISCELLANEOUS) IMPLANT
ELECTRODE REM PT RTRN 9FT ADLT (ELECTROSURGICAL) ×1 IMPLANT
FORCEPS JEWEL BIP 4-3/4 STR (INSTRUMENTS) ×3 IMPLANT
GAUZE 4X4 16PLY RFD (DISPOSABLE) ×3 IMPLANT
GLOVE BIO SURGEON STRL SZ7.5 (GLOVE) ×3 IMPLANT
GLOVE PROTEXIS LATEX SZ 7.5 (GLOVE) ×3 IMPLANT
GOWN STRL REUS W/ TWL LRG LVL3 (GOWN DISPOSABLE) ×3 IMPLANT
GOWN STRL REUS W/TWL LRG LVL3 (GOWN DISPOSABLE) ×6
HEMOSTAT SURGICEL 2X3 (HEMOSTASIS) ×3 IMPLANT
HOOK STAY BLUNT/RETRACTOR 5M (MISCELLANEOUS) ×3 IMPLANT
LABEL OR SOLS (LABEL) IMPLANT
NS IRRIG 500ML POUR BTL (IV SOLUTION) ×3 IMPLANT
PACK HEAD/NECK (MISCELLANEOUS) ×3 IMPLANT
PROBE NEUROSIGN BIPOL (MISCELLANEOUS) ×2 IMPLANT
PROBE NEUROSIGN BIPOLAR (MISCELLANEOUS) ×4
SHEARS HARMONIC 9CM CVD (BLADE) ×3 IMPLANT
SPONGE KITTNER 5P (MISCELLANEOUS) ×12 IMPLANT
STRAP SAFETY 5IN WIDE (MISCELLANEOUS) ×3 IMPLANT
SUT ETHILON 6 0 9-3 1X18 BLK (SUTURE) IMPLANT
SUT PROLENE 6 0 P 1 18 (SUTURE) ×3 IMPLANT
SUT SILK 2 0 (SUTURE) ×2
SUT SILK 2-0 18XBRD TIE 12 (SUTURE) ×1 IMPLANT
SUT VIC AB 4-0 RB1 18 (SUTURE) ×3 IMPLANT
SYSTEM CHEST DRAIN TLS 7FR (DRAIN) ×6 IMPLANT

## 2019-10-31 NOTE — Op Note (Signed)
10/31/2019  11:32 AM    Mike Vasquez  [id number]   Pre-Op Dx: Right thyroid nodule with high suspicion of cancer  Post-op Dx: Right thyroid nodule with high suspicion of cancer  Proc: Total thyroidectomy  Surg:  Huey Romans             assistant: Osie Cheeks  Anes:  GOT  EBL: 50 mL  Comp: None  Findings: Whitish firm nodule with in the thyroid capsule on the right mid thyroid gland.  Subcentimeter nodule at the superior pole on the right side.  No other specific nodules felt.  The nerve monitoring did not work on either side.  The nerve was found visually and tracked up to the thyroid and spared.  The nerve was tested on each side once the gland was removed and the muscle contracted well.  The superior parathyroid was found on the surface of the upper pole on both sides.  This was removed from the capsule and spared.  I did not see inferior parathyroids on either side but did not take the time to try to find them.    Procedure: The patient was brought to the operating room and placed in supine position.  He was given general anesthesia by oral endotracheal intubation.  The oral endotracheal tube was wrapped with electrodes for continuous monitoring of the recurrent laryngeal nerves.  It was placed under direct vision and it looked like it was in a good position when it was placed.  Once the monitors were hooked up there was continuous stimulation of lead II.  Could not tell if this was on the right or left laryngeal nerve.  Lead I did not show any stimulation.  When the patient was asleep the anterior neck was marked in a low skin crease about a fingers breath above the sternal notch.  The patient had a small shoulder roll placed.  The skin was injected with 1% Xylocaine with epi 1: 100,000 for infiltration around the skin and subcu at the marked incision line.  He was prepped and draped in sterile fashion.  Incision was created through the skin and subcu and then through  the platysma layer.  There is a small superior and inferior flap that was raised.  Strap muscles were divided in the midline to expose the thyroid gland.  The strap muscles were elevated over the right thyroid gland first.  The strap muscles were scarred down to the thyroid capsule and there is a lot of scarring on the side from the previous biopsy that was done.  Once we freed up the lateral border of the thyroid gland the superior pole was dissected out.  Again there is a lot of scar tissue here.  A 2-3cm white nodule was noted in the right mid thyroid gland that was not extremely firm but it was within the capsule of the gland.  The superior pole as it was freed had a parathyroid gland attached to it.  This was freed and the left in the neck attached to soft tissue.  The superior vessels were cut across with the harmonic.  The posterior thyroid seem to curl around into the tracheoesophageal groove, this was freed up and the thyroid gland was rotated medially.  It was scarred down some in its middle portion.  As it was rotated dissection was carried in the bed of the tracheoesophageal groove to find the recurrent laryngeal nerve.  The stimulator did not pick up any stimulation of the nerve.  We could visually see that this was the nerve contracted into the neck a little bit but then tracked it up to the base of the larynx.  We are able to free up the thyroid over the top of it.  Then cut across Berry's ligament and freed the attachments of the right thyroid from the anterior trachea.  This rolled the entire right thyroid off to midline.  The inferior vessels were cut across as we loosen this up.  The left side was then addressed by elevating the strap muscles over the thyroid gland.  There is no scarring on this side and the strap muscles elevated easily.  The superior pole was freed up and again a parathyroid gland was found attached to the superior pole.  This was freed and left in the thyroid bed.  The  midportion of the thyroid was rotated as well as the inferior portion.  The inferior thyroid vessels were cut across with the harmonic similar to cutting the superior thyroid vessels.  The recurrent laryngeal nerve was then found in the tracheoesophageal groove just lateral to the inferior larynx.  Again the side did not show any simulation on the nerve monitor.  We could visually tell this was the nerve retracted inferiorly and superiorly up to the larynx.  We freed up the thyroid above it and cut across Berry's ligament.  This freed up the gland to rotate and the remaining thyroid attachments were freed up so the entire specimen was removed.  A nylon suture was attached to the right superior pole for orientation.  The specimen was passed off for permanent section.  The larynx was palpated laterally and the nerve was stimulated and contraction of the laryngeal muscles could be felt.  This was done on both sides to make sure the nerve was intact and still working well.  The wound was irrigated copiously on both sides and there was no significant bleeding noted on either side.  Surgicel was placed into the wound bed and overlying the recurrent laryngeal nerve on both sides.  A 7 French TLS drain was placed each side crossing in the midline.  The strap muscles were loosely brought together with just 2 sutures of 4-0 Vicryl.  The platysma muscle was then closed using interrupted 4-0 Vicryls.  The subcu was then closed with interrupted 4-0 Vicryls.  The skin edges were held in apposition with a running locking 6-0 Prolene suture.  The drains were then hooked up to low continuous Vacutainer suction.  The wound was then covered with a small amount of bacitracin followed by Telfa and Tegaderm.  The drains were taped down and the Vacutainer's taped to his shoulder.  The patient was awakened and taken to the recovery room in satisfactory condition.  There were no operative complications.  Dispo:   To PACU to be  transferred then to the floor and observed overnight  Plan: We will make sure there is no unusual bleeding and pulled his drains in the morning.  We will watch for any signs of respiratory distress.  We will start him on calcium supplements and change Vacutainer's every 4 hours to make sure that any drainage is cleared.  Elon Alas Emireth Cockerham  10/31/2019 11:32 AM

## 2019-10-31 NOTE — Progress Notes (Signed)
Pt pain now 6/10. Dr. Randa Lynn notified. Acknowledged. Orders received.

## 2019-10-31 NOTE — Anesthesia Preprocedure Evaluation (Signed)
Anesthesia Evaluation  Patient identified by MRN, date of birth, ID band Patient awake    Reviewed: Allergy & Precautions, NPO status , Patient's Chart, lab work & pertinent test results  History of Anesthesia Complications Negative for: history of anesthetic complications  Airway Mallampati: II  TM Distance: >3 FB Neck ROM: Full    Dental  (+) Implants   Pulmonary neg pulmonary ROS, neg sleep apnea, neg COPD, former smoker,    breath sounds clear to auscultation- rhonchi (-) wheezing      Cardiovascular Exercise Tolerance: Good (-) hypertension(-) CAD, (-) Past MI, (-) Cardiac Stents and (-) CABG + dysrhythmias (hx of aflutter s/p ablation, recent episodes of bradycardia, close f/u with cardiology, no need for pacemaker yet)  Rhythm:Regular Rate:Normal - Systolic murmurs and - Diastolic murmurs    Neuro/Psych neg Seizures negative neurological ROS  negative psych ROS   GI/Hepatic negative GI ROS, Neg liver ROS,   Endo/Other  negative endocrine ROSneg diabetes  Renal/GU negative Renal ROS     Musculoskeletal negative musculoskeletal ROS (+)   Abdominal (+) - obese,   Peds  Hematology negative hematology ROS (+)   Anesthesia Other Findings Past Medical History: No date: Dysrhythmia No date: Fatty liver     Comment:  history No date: Lower extremity edema No date: Mass of thyroid region No date: Neuropathy   Reproductive/Obstetrics                             Anesthesia Physical Anesthesia Plan  ASA: II  Anesthesia Plan: General   Post-op Pain Management:    Induction: Intravenous  PONV Risk Score and Plan: 1  Airway Management Planned: Oral ETT  Additional Equipment:   Intra-op Plan:   Post-operative Plan: Extubation in OR  Informed Consent: I have reviewed the patients History and Physical, chart, labs and discussed the procedure including the risks, benefits and  alternatives for the proposed anesthesia with the patient or authorized representative who has indicated his/her understanding and acceptance.     Dental advisory given  Plan Discussed with: CRNA and Anesthesiologist  Anesthesia Plan Comments:         Anesthesia Quick Evaluation

## 2019-10-31 NOTE — Transfer of Care (Signed)
Immediate Anesthesia Transfer of Care Note  Patient: Mike Vasquez  Procedure(s) Performed: THYROIDECTOMY (Bilateral )  Patient Location: PACU  Anesthesia Type:General  Level of Consciousness: awake, drowsy and patient cooperative  Airway & Oxygen Therapy: Patient Spontanous Breathing and Patient connected to face mask oxygen  Post-op Assessment: Report given to RN and Post -op Vital signs reviewed and stable  Post vital signs: Reviewed and stable  Last Vitals:  Vitals Value Taken Time  BP 173/83 10/31/19 1135  Temp 36.4 C 10/31/19 1135  Pulse 86 10/31/19 1138  Resp 17 10/31/19 1138  SpO2 99 % 10/31/19 1138  Vitals shown include unvalidated device data.  Last Pain:  Vitals:   10/31/19 0817  TempSrc: Tympanic  PainSc: 0-No pain         Complications: No apparent anesthesia complications

## 2019-10-31 NOTE — Anesthesia Postprocedure Evaluation (Signed)
Anesthesia Post Note  Patient: Mike Vasquez  Procedure(s) Performed: THYROIDECTOMY (Bilateral )  Patient location during evaluation: PACU Anesthesia Type: General Level of consciousness: awake and alert and oriented Pain management: pain level controlled Vital Signs Assessment: post-procedure vital signs reviewed and stable Respiratory status: spontaneous breathing, nonlabored ventilation and respiratory function stable Cardiovascular status: blood pressure returned to baseline and stable Postop Assessment: no signs of nausea or vomiting Anesthetic complications: no     Last Vitals:  Vitals:   10/31/19 1416 10/31/19 1510  BP: 136/64 139/69  Pulse: 70 66  Resp: 11 18  Temp: 36.7 C 36.9 C  SpO2: 94% 95%    Last Pain:  Vitals:   10/31/19 1510  TempSrc: Oral  PainSc:                  [place]ia Francisco

## 2019-10-31 NOTE — Anesthesia Procedure Notes (Signed)
Procedure Name: Intubation Performed by: Kelton Pillar, CRNA Pre-anesthesia Checklist: Patient identified, Emergency Drugs available, Suction available and Patient being monitored Patient Re-evaluated:Patient Re-evaluated prior to induction Oxygen Delivery Method: Circle system utilized Preoxygenation: Pre-oxygenation with 100% oxygen Induction Type: IV induction Ventilation: Mask ventilation without difficulty Laryngoscope Size: McGraph and 3 Grade View: Grade I Tube type: Oral Tube size: 7.5 mm Number of attempts: 1 Airway Equipment and Method: Stylet Placement Confirmation: ETT inserted through vocal cords under direct vision,  positive ETCO2,  CO2 detector and breath sounds checked- equal and bilateral Secured at: 21 cm Tube secured with: Tape Dental Injury: Teeth and Oropharynx as per pre-operative assessment

## 2019-10-31 NOTE — Anesthesia Post-op Follow-up Note (Signed)
Anesthesia QCDR form completed.        

## 2019-10-31 NOTE — H&P (Signed)
H&P has been reviewed and patient reevaluated, no changes necessary. To be downloaded later.  

## 2019-11-01 ENCOUNTER — Encounter: Payer: Self-pay | Admitting: Otolaryngology

## 2019-11-01 DIAGNOSIS — C73 Malignant neoplasm of thyroid gland: Secondary | ICD-10-CM | POA: Diagnosis not present

## 2019-11-01 LAB — CALCIUM: Calcium: 8.7 mg/dL — ABNORMAL LOW (ref 8.9–10.3)

## 2019-11-01 MED ORDER — HYDROCODONE-ACETAMINOPHEN 5-325 MG PO TABS: 1.0000 | ORAL_TABLET | Freq: Four times a day (QID) | ORAL | 0 refills | Status: AC | PRN

## 2019-11-01 NOTE — Discharge Summary (Signed)
Physician Discharge Summary  Patient ID: JEUDY TUONG MRN: [id number] DOB/AGE: 1956-07-27 63 y.o.  Admit date: 10/31/2019 Discharge date: 11/01/2019  Admission Diagnoses: Right thyroid tumor with suspicion of cancer  Discharge Diagnoses: Right thyroid tumor with suspicion of cancer Active Problems:   S/P complete thyroidectomy   Discharged Condition: good  Hospital Course: Patient was admitted to the hospital yesterday and taken to the operating room for a complete thyroidectomy.  Patient tolerated the procedure well.  Drains were removed today.  The recurrent laryngeal nerves were spared as well as parathyroids.  His calcium level was in the normal range this morning.  His wound is dry and there is no sign of swelling.  His voice is clear and he is breathing easily.  The dressing is changed.  He will return to the office in 1 week for suture removal.  We should have the path report by that time to go over it decide upon further plans.  I have not started thyroid medication yet, will discuss that on Friday and get him started then depending on the path report.  Consults: None  Significant Diagnostic Studies: labs: Calcium 8.7  Treatments: surgery: Total thyroidectomy  Discharge Exam: Blood pressure 115/67, pulse (!) 50, temperature (!) 97.5 F (36.4 C), temperature source Oral, resp. rate 20, height 6\' 2"  (1.88 m), weight 102.1 kg, SpO2 97 %. His neck is flat and there is no sign of any bruising here.  The drains have been removed.  His voice is clear  Disposition: Discharge disposition: 01-Home or Self Care       Discharge Instructions    Call MD for:  hives   Complete by: As directed    Call MD for:  persistant nausea and vomiting   Complete by: As directed    Call MD for:  redness, tenderness, or signs of infection (pain, swelling, redness, odor or green/yellow discharge around incision site)   Complete by: As directed    Call MD for:  temperature >100.4   Complete by:  As directed    Diet general   Complete by: As directed    Increase activity slowly   Complete by: As directed      Allergies as of 11/01/2019      Reactions   Lisinopril    cough   Statins    Joint pain      Medication List    TAKE these medications   albuterol 108 (90 Base) MCG/ACT inhaler Commonly known as: VENTOLIN HFA Inhale into the lungs every 6 (six) hours as needed for wheezing or shortness of breath.   b complex vitamins tablet Take 1 tablet by mouth daily.   bumetanide 1 MG tablet Commonly known as: BUMEX Take 1 mg by mouth daily.   celecoxib 200 MG capsule Commonly known as: CELEBREX Take 200 mg by mouth 2 (two) times daily.   fenofibrate 160 MG tablet Take 160 mg by mouth daily.   folic acid Q000111Q MCG tablet Commonly known as: FOLVITE Take 400 mcg by mouth daily.   gabapentin 300 MG capsule Commonly known as: NEURONTIN Take 300 mg by mouth 3 (three) times daily.   HYDROcodone-acetaminophen 5-325 MG tablet Commonly known as: NORCO/VICODIN Take 1-2 tablets by mouth every 6 (six) hours as needed for up to 7 days for moderate pain.   testosterone cypionate 200 MG/ML injection Commonly known as: DEPOTESTOSTERONE CYPIONATE Inject 100 mg into the muscle every 7 (seven) days.   tiZANidine 4 MG tablet Commonly known  as: ZANAFLEX Take 4 mg by mouth 2 (two) times daily as needed for muscle spasms.   Vitamin D 125 MCG (5000 UT) Caps Take 5,000 Units by mouth daily.   zinc gluconate 50 MG tablet Take 50 mg by mouth daily.        SignedHuey Romans 11/01/2019, 8:48 AM

## 2019-11-04 LAB — SURGICAL PATHOLOGY

## 2019-12-02 ENCOUNTER — Other Ambulatory Visit: Payer: Self-pay | Admitting: Otolaryngology

## 2019-12-02 DIAGNOSIS — C73 Malignant neoplasm of thyroid gland: Secondary | ICD-10-CM

## 2019-12-09 ENCOUNTER — Other Ambulatory Visit: Payer: Self-pay

## 2019-12-09 ENCOUNTER — Encounter
Admission: RE | Admit: 2019-12-09 | Discharge: 2019-12-09 | Disposition: A | Discharge status: Home / Self Care | Payer: BC Managed Care – PPO | Source: Ambulatory Visit | Attending: Otolaryngology | Admitting: Otolaryngology

## 2019-12-09 DIAGNOSIS — C73 Malignant neoplasm of thyroid gland: Secondary | ICD-10-CM | POA: Diagnosis not present

## 2019-12-09 MED ORDER — THYROTROPIN ALFA 1.1 MG IM SOLR
0.9000 mg | INTRAMUSCULAR | Status: AC
  Administered 2019-12-09: 0.9 mg via INTRAMUSCULAR
  Filled 2019-12-09: qty 0.9

## 2019-12-10 ENCOUNTER — Encounter
Admission: RE | Admit: 2019-12-10 | Discharge: 2019-12-10 | Disposition: A | Discharge status: Home / Self Care | Payer: BC Managed Care – PPO | Source: Ambulatory Visit | Attending: Otolaryngology | Admitting: Otolaryngology

## 2019-12-10 DIAGNOSIS — C73 Malignant neoplasm of thyroid gland: Secondary | ICD-10-CM | POA: Insufficient documentation

## 2019-12-10 MED ORDER — THYROTROPIN ALFA 1.1 MG IM SOLR
0.9000 mg | INTRAMUSCULAR | Status: AC
  Administered 2019-12-10: 0.9 mg via INTRAMUSCULAR
  Filled 2019-12-10: qty 0.9

## 2019-12-11 ENCOUNTER — Encounter
Admission: RE | Admit: 2019-12-11 | Discharge: 2019-12-11 | Disposition: A | Discharge status: Home / Self Care | Payer: BC Managed Care – PPO | Source: Ambulatory Visit | Attending: Otolaryngology | Admitting: Otolaryngology

## 2019-12-11 DIAGNOSIS — C73 Malignant neoplasm of thyroid gland: Secondary | ICD-10-CM | POA: Insufficient documentation

## 2019-12-11 MED ORDER — SODIUM IODIDE I 131 CAPSULE
75.1800 | Freq: Once | INTRAVENOUS | Status: AC | PRN
  Administered 2019-12-11: 75.18 via ORAL

## 2019-12-19 ENCOUNTER — Encounter
Admission: RE | Admit: 2019-12-19 | Discharge: 2019-12-19 | Disposition: A | Discharge status: Home / Self Care | Payer: BC Managed Care – PPO | Source: Ambulatory Visit | Attending: Otolaryngology | Admitting: Otolaryngology

## 2019-12-19 DIAGNOSIS — C73 Malignant neoplasm of thyroid gland: Secondary | ICD-10-CM | POA: Insufficient documentation

## 2021-07-21 IMAGING — NM NM [ID] THYROID CANCER METS SP CA TX
1 series · 2 of 2 positions shown · non-contrast
Comparison: None.

CLINICAL DATA: Papillary thyroid carcinoma. T1a N0 load
intermediate risk disease. Risk factor includes tumor capsular
12/11/1999.

EXAM:
NUCLEAR MEDICINE Z-IFI POST THERAPY WHOLE BODY SCAN
TECHNIQUE: The patient received 75.2 mCi Z-IFI sodium iodide for the treatment
of thyroid cancer within the past 10 days. The patient returns
today, and whole body scanning was performed in the anterior and
posterior projections.

[Series 1000: (id) 9 days post therapy · 2.40mm/px · 2 of 2 frames shown]
[frame 1/2]
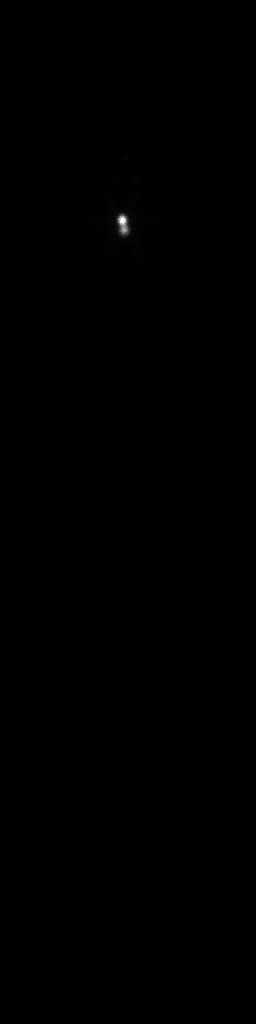
[frame 2/2]
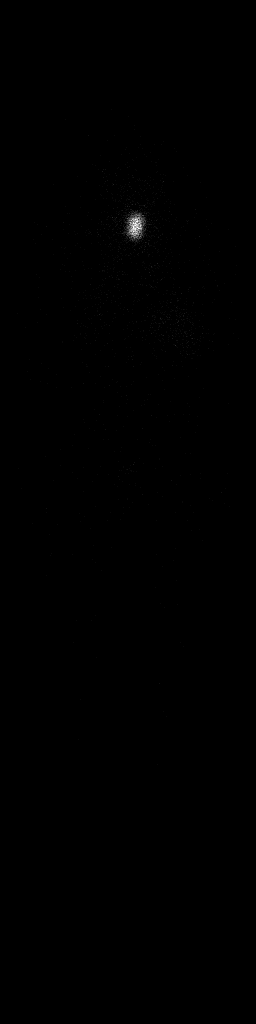

[2 of 2 positions shown; findings below may reference images not displayed]

FINDINGS: Two foci of intense radiotracer uptake within the central thyroid
bed. No evidence of metastatic disease outside of the thyroid bed.
Physiologic uptake noted in the liver.
IMPRESSION: 1. Two foci of uptake in the thyroid bed with differential of
remnant thyroid tissue versus remnant thyroid tissue and thyroid
carcinoma within the extracapsular tissue or a lymph node.
2. No evidence of distant metastatic disease.

## 2022-06-13 ENCOUNTER — Emergency Department: Payer: Medicare Other

## 2022-06-13 ENCOUNTER — Encounter: Payer: Self-pay | Admitting: Emergency Medicine

## 2022-06-13 ENCOUNTER — Observation Stay
Admission: EM | Admit: 2022-06-13 | Discharge: 2022-06-14 | Disposition: A | Discharge status: Home / Self Care | Payer: Medicare Other | Attending: Internal Medicine | Admitting: Internal Medicine

## 2022-06-13 DIAGNOSIS — Z87891 Personal history of nicotine dependence: Secondary | ICD-10-CM | POA: Diagnosis not present

## 2022-06-13 DIAGNOSIS — I2510 Atherosclerotic heart disease of native coronary artery without angina pectoris: Secondary | ICD-10-CM | POA: Insufficient documentation

## 2022-06-13 DIAGNOSIS — I110 Hypertensive heart disease with heart failure: Secondary | ICD-10-CM | POA: Insufficient documentation

## 2022-06-13 DIAGNOSIS — Z79899 Other long term (current) drug therapy: Secondary | ICD-10-CM | POA: Insufficient documentation

## 2022-06-13 DIAGNOSIS — E785 Hyperlipidemia, unspecified: Secondary | ICD-10-CM | POA: Diagnosis not present

## 2022-06-13 DIAGNOSIS — R079 Chest pain, unspecified: Principal | ICD-10-CM

## 2022-06-13 DIAGNOSIS — E876 Hypokalemia: Secondary | ICD-10-CM | POA: Diagnosis not present

## 2022-06-13 DIAGNOSIS — I5032 Chronic diastolic (congestive) heart failure: Secondary | ICD-10-CM | POA: Insufficient documentation

## 2022-06-13 DIAGNOSIS — E039 Hypothyroidism, unspecified: Secondary | ICD-10-CM | POA: Insufficient documentation

## 2022-06-13 DIAGNOSIS — I4891 Unspecified atrial fibrillation: Secondary | ICD-10-CM | POA: Diagnosis not present

## 2022-06-13 DIAGNOSIS — I10 Essential (primary) hypertension: Secondary | ICD-10-CM

## 2022-06-13 LAB — CBC
HCT: 50.3 % (ref 39.0–52.0)
Hemoglobin: 17.3 g/dL — ABNORMAL HIGH (ref 13.0–17.0)
MCH: 31.1 pg (ref 26.0–34.0)
MCHC: 34.4 g/dL (ref 30.0–36.0)
MCV: 90.5 fL (ref 80.0–100.0)
Platelets: 231 10*3/uL (ref 150–400)
RBC: 5.56 MIL/uL (ref 4.22–5.81)
RDW: 12.8 % (ref 11.5–15.5)
WBC: 6.6 10*3/uL (ref 4.0–10.5)
nRBC: 0 % (ref 0.0–0.2)

## 2022-06-13 NOTE — ED Triage Notes (Signed)
Pt presents via POV with complaints of mid-sternal CP that started around 1 hour ago. Pt also endorses a headache, left sided jaw pain, and nausea that started with the pain as well. Hx of multiple cardiac catherizations - hx of aflutter. Denies SOB.

## 2022-06-14 DIAGNOSIS — E039 Hypothyroidism, unspecified: Secondary | ICD-10-CM

## 2022-06-14 DIAGNOSIS — E876 Hypokalemia: Secondary | ICD-10-CM | POA: Diagnosis not present

## 2022-06-14 DIAGNOSIS — I10 Essential (primary) hypertension: Secondary | ICD-10-CM

## 2022-06-14 DIAGNOSIS — I5032 Chronic diastolic (congestive) heart failure: Secondary | ICD-10-CM | POA: Diagnosis not present

## 2022-06-14 DIAGNOSIS — E785 Hyperlipidemia, unspecified: Secondary | ICD-10-CM | POA: Diagnosis not present

## 2022-06-14 DIAGNOSIS — I4891 Unspecified atrial fibrillation: Secondary | ICD-10-CM | POA: Diagnosis not present

## 2022-06-14 LAB — BASIC METABOLIC PANEL
Anion gap: 7 (ref 5–15)
Anion gap: 9 (ref 5–15)
BUN: 34 mg/dL — ABNORMAL HIGH (ref 8–23)
BUN: 34 mg/dL — ABNORMAL HIGH (ref 8–23)
CO2: 22 mmol/L (ref 22–32)
CO2: 25 mmol/L (ref 22–32)
Calcium: 9 mg/dL (ref 8.9–10.3)
Calcium: 9.5 mg/dL (ref 8.9–10.3)
Chloride: 106 mmol/L (ref 98–111)
Chloride: 109 mmol/L (ref 98–111)
Creatinine, Ser: 1.31 mg/dL — ABNORMAL HIGH (ref 0.61–1.24)
Creatinine, Ser: 1.44 mg/dL — ABNORMAL HIGH (ref 0.61–1.24)
GFR, Estimated: 53 mL/min — ABNORMAL LOW (ref >60)
GFR, Estimated: 60 mL/min — ABNORMAL LOW (ref >60)
Glucose, Bld: 137 mg/dL — ABNORMAL HIGH (ref 70–99)
Glucose, Bld: 85 mg/dL (ref 70–99)
Potassium: 3.2 mmol/L — ABNORMAL LOW (ref 3.5–5.1)
Potassium: 5.1 mmol/L (ref 3.5–5.1)
Sodium: 137 mmol/L (ref 135–145)
Sodium: 141 mmol/L (ref 135–145)

## 2022-06-14 LAB — TROPONIN I (HIGH SENSITIVITY)
Troponin I (High Sensitivity): 34 ng/L — ABNORMAL HIGH (ref <18)
Troponin I (High Sensitivity): 48 ng/L — ABNORMAL HIGH (ref <18)
Troponin I (High Sensitivity): 51 ng/L — ABNORMAL HIGH (ref <18)

## 2022-06-14 LAB — CBC
HCT: 46.9 % (ref 39.0–52.0)
Hemoglobin: 15.9 g/dL (ref 13.0–17.0)
MCH: 31.5 pg (ref 26.0–34.0)
MCHC: 33.9 g/dL (ref 30.0–36.0)
MCV: 93.1 fL (ref 80.0–100.0)
Platelets: 211 10*3/uL (ref 150–400)
RBC: 5.04 MIL/uL (ref 4.22–5.81)
RDW: 13 % (ref 11.5–15.5)
WBC: 5.7 10*3/uL (ref 4.0–10.5)
nRBC: 0 % (ref 0.0–0.2)

## 2022-06-14 LAB — HIV ANTIBODY (ROUTINE TESTING W REFLEX): HIV Screen 4th Generation wRfx: NONREACTIVE

## 2022-06-14 LAB — MAGNESIUM
Magnesium: 2.4 mg/dL (ref 1.7–2.4)
Magnesium: 2.6 mg/dL — ABNORMAL HIGH (ref 1.7–2.4)

## 2022-06-14 LAB — BRAIN NATRIURETIC PEPTIDE: B Natriuretic Peptide: 41 pg/mL (ref 0.0–100.0)

## 2022-06-14 LAB — TSH: TSH: 2.578 u[IU]/mL (ref 0.350–4.500)

## 2022-06-14 MED ORDER — NITROGLYCERIN 0.4 MG SL SUBL: 0.4000 mg | SUBLINGUAL_TABLET | SUBLINGUAL | Status: DC | PRN

## 2022-06-14 MED ORDER — VITAMIN B-12 1000 MCG PO TABS
1000.0000 ug | ORAL_TABLET | Freq: Every day | ORAL | Status: DC
Start: 2022-06-14 — End: 2022-06-14
  Administered 2022-06-14: 1000 ug via ORAL
  Filled 2022-06-14: qty 1

## 2022-06-14 MED ORDER — METOPROLOL TARTRATE 25 MG PO TABS: 25.0000 mg | ORAL_TABLET | Freq: Four times a day (QID) | ORAL | Status: DC | PRN

## 2022-06-14 MED ORDER — METOPROLOL TARTRATE 25 MG PO TABS: 25.0000 mg | ORAL_TABLET | Freq: Four times a day (QID) | ORAL | 1 refills | Status: DC | PRN

## 2022-06-14 MED ORDER — VITAMIN D 25 MCG (1000 UNIT) PO TABS
5000.0000 [IU] | ORAL_TABLET | Freq: Every day | ORAL | Status: DC
  Filled 2022-06-14: qty 5

## 2022-06-14 MED ORDER — ONDANSETRON HCL 4 MG PO TABS: 4.0000 mg | ORAL_TABLET | Freq: Four times a day (QID) | ORAL | Status: DC | PRN

## 2022-06-14 MED ORDER — TESTOSTERONE CYPIONATE 200 MG/ML IM SOLN
100.0000 mg | INTRAMUSCULAR | Status: DC
Start: 2022-06-19 — End: 2022-06-14

## 2022-06-14 MED ORDER — BUMETANIDE 1 MG PO TABS
1.0000 mg | ORAL_TABLET | Freq: Two times a day (BID) | ORAL | Status: DC
  Administered 2022-06-14: 1 mg via ORAL
  Filled 2022-06-14 (×2): qty 1

## 2022-06-14 MED ORDER — ALBUTEROL SULFATE (2.5 MG/3ML) 0.083% IN NEBU: 3.0000 mL | INHALATION_SOLUTION | Freq: Four times a day (QID) | RESPIRATORY_TRACT | Status: DC | PRN

## 2022-06-14 MED ORDER — ACETAMINOPHEN 325 MG PO TABS
650.0000 mg | ORAL_TABLET | Freq: Four times a day (QID) | ORAL | Status: DC | PRN
  Administered 2022-06-14: 650 mg via ORAL
  Filled 2022-06-14: qty 2

## 2022-06-14 MED ORDER — DILTIAZEM HCL-DEXTROSE 125-5 MG/125ML-% IV SOLN (PREMIX): 5.0000 mg/h | INTRAVENOUS | Status: DC

## 2022-06-14 MED ORDER — POTASSIUM CHLORIDE IN NACL 20-0.9 MEQ/L-% IV SOLN
INTRAVENOUS | Status: DC
  Filled 2022-06-14: qty 1000

## 2022-06-14 MED ORDER — POTASSIUM CHLORIDE CRYS ER 20 MEQ PO TBCR
40.0000 meq | EXTENDED_RELEASE_TABLET | Freq: Once | ORAL | Status: AC
  Administered 2022-06-14: 40 meq via ORAL
  Filled 2022-06-14: qty 2

## 2022-06-14 MED ORDER — RIVAROXABAN 20 MG PO TABS: 20.0000 mg | ORAL_TABLET | Freq: Every day | ORAL | 1 refills | Status: DC

## 2022-06-14 MED ORDER — LEVOTHYROXINE SODIUM 50 MCG PO TABS
175.0000 ug | ORAL_TABLET | Freq: Every day | ORAL | Status: DC
  Administered 2022-06-14: 175 ug via ORAL
  Filled 2022-06-14: qty 4

## 2022-06-14 MED ORDER — METOPROLOL TARTRATE 25 MG PO TABS: 25.0000 mg | ORAL_TABLET | Freq: Four times a day (QID) | ORAL | 1 refills | Status: AC | PRN

## 2022-06-14 MED ORDER — ENOXAPARIN SODIUM 60 MG/0.6ML IJ SOSY
0.5000 mg/kg | PREFILLED_SYRINGE | INTRAMUSCULAR | Status: DC
  Administered 2022-06-14: 57.5 mg via SUBCUTANEOUS
  Filled 2022-06-14: qty 0.6

## 2022-06-14 MED ORDER — DILTIAZEM LOAD VIA INFUSION
10.0000 mg | Freq: Once | INTRAVENOUS | Status: DC
  Filled 2022-06-14: qty 10

## 2022-06-14 MED ORDER — TIZANIDINE HCL 2 MG PO TABS: 4.0000 mg | ORAL_TABLET | Freq: Two times a day (BID) | ORAL | Status: DC | PRN

## 2022-06-14 MED ORDER — ASPIRIN 81 MG PO CHEW
324.0000 mg | CHEWABLE_TABLET | Freq: Once | ORAL | Status: AC
  Administered 2022-06-14: 324 mg via ORAL
  Filled 2022-06-14: qty 4

## 2022-06-14 MED ORDER — ONDANSETRON HCL 4 MG/2ML IJ SOLN
4.0000 mg | Freq: Once | INTRAMUSCULAR | Status: AC
  Administered 2022-06-14: 4 mg via INTRAVENOUS
  Filled 2022-06-14: qty 2

## 2022-06-14 MED ORDER — ROSUVASTATIN CALCIUM 5 MG PO TABS: 5.0000 mg | ORAL_TABLET | Freq: Every day | ORAL | Status: DC

## 2022-06-14 MED ORDER — CELECOXIB 200 MG PO CAPS: 200.0000 mg | ORAL_CAPSULE | Freq: Two times a day (BID) | ORAL | Status: AC | PRN

## 2022-06-14 MED ORDER — RIVAROXABAN 20 MG PO TABS: 20.0000 mg | ORAL_TABLET | Freq: Every day | ORAL | 1 refills | Status: AC

## 2022-06-14 MED ORDER — POTASSIUM CHLORIDE 20 MEQ PO PACK
40.0000 meq | PACK | Freq: Once | ORAL | Status: AC
  Administered 2022-06-14: 40 meq via ORAL
  Filled 2022-06-14: qty 2

## 2022-06-14 MED ORDER — ONDANSETRON HCL 4 MG/2ML IJ SOLN: 4.0000 mg | Freq: Four times a day (QID) | INTRAMUSCULAR | Status: DC | PRN

## 2022-06-14 MED ORDER — MAGNESIUM HYDROXIDE 400 MG/5ML PO SUSP: 30.0000 mL | Freq: Every day | ORAL | Status: DC | PRN

## 2022-06-14 MED ORDER — SODIUM CHLORIDE 0.9 % IV SOLN: INTRAVENOUS | Status: DC

## 2022-06-14 MED ORDER — RIVAROXABAN 20 MG PO TABS: 20.0000 mg | ORAL_TABLET | Freq: Every day | ORAL | Status: DC

## 2022-06-14 MED ORDER — ACETAMINOPHEN 650 MG RE SUPP: 650.0000 mg | Freq: Four times a day (QID) | RECTAL | Status: DC | PRN

## 2022-06-14 MED ORDER — TRAZODONE HCL 50 MG PO TABS: 25.0000 mg | ORAL_TABLET | Freq: Every evening | ORAL | Status: DC | PRN

## 2022-06-14 NOTE — Care Management Obs Status (Signed)
[place] NOTIFICATION   Patient Details  Name: Mike Vasquez MRN: [id number] Date of Birth: Feb 10, 1956   Medicare Observation Status Notification Given:  Yes    Alberteen Sam, LCSW 06/14/2022, 3:33 PM

## 2022-06-14 NOTE — ED Notes (Signed)
Pt now in sinus rhythm- EKG given to provider.

## 2022-06-14 NOTE — Discharge Summary (Signed)
Physician Discharge Summary  Mike Vasquez AST:[id number] DOB: 1956-03-09 DOA: 06/13/2022  PCP: Pcp, No  Admit date: 06/13/2022 Discharge date: 06/14/2022  Time spent: 60 minutes  Recommendations for Outpatient Follow-up:  Follow-up with Dr. Clayborn Bigness, cardiology in 1 week.  On follow-up patient will need a 2D echo done and further management of paroxysmal atrial fibrillation.  Patient also need a basic metabolic profile and magnesium level checked to follow-up on electrolytes and renal function.   Discharge Diagnoses:  Principal Problem:   Atrial fibrillation with RVR (HCC) Active Problems:   Hypokalemia   Chronic diastolic CHF (congestive heart failure) (HCC)   Dyslipidemia   Hypothyroidism   Essential hypertension   Discharge Condition: Stable and improved.  Diet recommendation: Heart healthy  Filed Weights   06/13/22 2340  Weight: 113.4 kg    History of present illness:  HPI per Dr. Shelly Coss is a 66 y.o. male with medical history significant for paroxysmal atrial fibrillation status post cardiac ablation in [place], grade 1 diastolic dysfunction, hypertension, coronary artery disease, presented to the emergency room with acute onset of intermittent chest pain for the last month with associated dyspnea and palpitations.  He had a recent 20 pound weight gain.  Tonight at 10:30 PM he started having palpitations with associated chest tightness and radiation to his neck and his jaw.  He admitted to associated dyspnea and dizziness with nausea and diaphoresis without vomiting.  Since his cardiac ablation he has not been on medications for atrial fibrillation.  He had 2 previous unsuccessful cardioversions.  ED Course: Upon presentation to the emergency room BP was 139/110 and heart rate 139 with otherwise normal vital signs.  Labs revealed mild hypokalemia 3.2 and magnesium levels 2.4.  BUN and creatinine were 34/1.44 from previous normal levels.  High sensitive troponin was  34 and BNP 41.  CBC showed hemoconcentration and was otherwise unremarkable.  TSH was 2.578. EKG as reviewed by me : EKG showed atrial fibrillation with rapid ventricular sponsor of 137 with T wave inversion inferiorly and associated inferior and lateral ST segment depression and later on he spontaneously converted to normal sinus rhythm with a rate of 73 with sinus pause and prolonged PR interval and suspected left atrial enlargement Imaging: Portable chest x-ray showed mild bibasilar atelectasis.  The patient was given 40 aspirin, 4 mg of IV Zofran and 40 mill Cabbell p.o. potassium chloride.  He will be admitted to a progressive unit bed for further evaluation and management.  Hospital Course:   Assessment and Plan: * Atrial fibrillation with RVR ([place]) - This is clearly paroxysmal atrial fibrillation with RVR. - Patient admitted with A-fib with RVR and spontaneously converted to normal sinus rhythm.  -Patient monitored in the ED for recurrent A-fib.  -Electrolytes were aggressively repleted.  -Magnesium level noted normal.  -TSH noted within normal limits at 2.578.  -Cardiac enzymes minimally elevated but flat and felt likely secondary to demand ischemia in the setting of tachycardia per cardiology. -Cardiology consulted who assessed the patient and recommended patient be resumed on Xarelto 20 mg daily for stroke risk reduction and also recommended patient on metoprolol 25 mg as needed for sustained heart rates > 150, -Close outpatient follow-up with Dr. Clayborn Bigness in 1 to 2 weeks for echo and consideration for ambulatory monitoring. -Patient was cleared by cardiology for discharge home, patient also anxious to be discharged and as such patient be discharged home in stable and improved condition.  - Outpatient follow-up with cardiology.  Hypokalemia - Repleted aggressively.   -Magnesium noted within normal limits.   -Hypokalemia had resolved by day of discharge.  Essential hypertension -  Patient maintained on home regimen of antihypertensive medication.  Hypothyroidism - TSH noted within normal limits.   -Patient maintained on home regimen Synthroid.    Dyslipidemia - Patient maintained on home regimen fenofibrate and statin therapy.  Chronic diastolic CHF (congestive heart failure) (HCC) - The patient does not seem to be in acute on chronic CHF. - Patient maintained on home regimen Bumex.  -Outpatient follow-up with cardiology.        Procedures: Chest x-ray 06/14/2022   Consultations: Cardiology: Orinda Kenner, PA 06/14/2022   Discharge Exam: Vitals:   06/14/22 1230 06/14/22 1300  BP: (!) 166/61 (!) 136/56  Pulse: 64 64  Resp: 13 16  Temp:  97.9 F (36.6 C)  SpO2: 96% 93%    General: NAD Cardiovascular: Regular rate rhythm no murmurs rubs or gallops.  No JVD.  No lower extremity edema. Respiratory: Lungs clear to auscultation bilaterally.  No wheezes, no crackles, no rhonchi.  Discharge Instructions   Discharge Instructions     Diet - low sodium heart healthy   Complete by: As directed    Increase activity slowly   Complete by: As directed       Allergies as of 06/14/2022       Reactions   Lisinopril    cough   Statins    Joint pain        Medication List     STOP taking these medications    folic acid 361 MCG tablet Commonly known as: FOLVITE       TAKE these medications    albuterol 108 (90 Base) MCG/ACT inhaler Commonly known as: VENTOLIN HFA Inhale into the lungs every 6 (six) hours as needed for wheezing or shortness of breath.   bumetanide 1 MG tablet Commonly known as: BUMEX Take 1 mg by mouth 2 (two) times daily.   celecoxib 200 MG capsule Commonly known as: CELEBREX Take 1 capsule (200 mg total) by mouth 2 (two) times daily as needed for mild pain. What changed:  when to take this reasons to take this   Cholecalciferol 125 MCG (5000 UT) Tabs Take 1 tablet by mouth daily.   fenofibrate 160 MG  tablet Take 160 mg by mouth daily.   levothyroxine 175 MCG tablet Commonly known as: SYNTHROID Take 175 mcg by mouth daily.   metoprolol tartrate 25 MG tablet Commonly known as: LOPRESSOR Take 1 tablet (25 mg total) by mouth every 6 (six) hours as needed (for heart rate sustained >150 x 5 minutes).   rivaroxaban 20 MG Tabs tablet Commonly known as: XARELTO Take 1 tablet (20 mg total) by mouth daily. Start taking on: June 15, 2022   rosuvastatin 5 MG tablet Commonly known as: CRESTOR Take 5 mg by mouth at bedtime.   testosterone cypionate 200 MG/ML injection Commonly known as: DEPOTESTOSTERONE CYPIONATE Inject 100 mg into the muscle every 7 (seven) days.   tiZANidine 4 MG tablet Commonly known as: ZANAFLEX Take 4 mg by mouth 2 (two) times daily as needed for muscle spasms.   vitamin B-12 1000 MCG tablet Commonly known as: CYANOCOBALAMIN Take 1,000 mcg by mouth daily.       Allergies  Allergen Reactions   Lisinopril     cough   Statins     Joint pain    Follow-up Information     Yolonda Kida, MD.  Go in 1 week(s).   Specialties: Cardiology, Internal Medicine Contact information: [address] [place] [address] [phone]                  The results of significant diagnostics from this hospitalization (including imaging, microbiology, ancillary and laboratory) are listed below for reference.    Significant Diagnostic Studies: DG Chest Port 1 View  Result Date: 06/14/2022 CLINICAL DATA:  Chest pain. EXAM: PORTABLE CHEST 1 VIEW COMPARISON:  06/15/2014 FINDINGS: The cardiomediastinal contours are normal. Minor atelectasis at the lung bases. Pulmonary vasculature is normal. No consolidation, pleural effusion, or pneumothorax. No acute osseous abnormalities are seen. IMPRESSION: Minor bibasilar atelectasis. Electronically Signed   By: Keith Rake M.D.   On: 06/14/2022 00:03    Microbiology: No results found for this or any previous  visit (from the past 240 hour(s)).   Labs: Basic Metabolic Panel: Recent Labs  Lab 06/13/22 2347 06/14/22 0008 06/14/22 0520  NA 137  --  141  K 3.2*  --  5.1  CL 106  --  109  CO2 22  --  25  GLUCOSE 137*  --  85  BUN 34*  --  34*  CREATININE 1.44*  --  1.31*  CALCIUM 9.5  --  9.0  MG  --  2.4 2.6*   Liver Function Tests: No results for input(s): "AST", "ALT", "ALKPHOS", "BILITOT", "PROT", "ALBUMIN" in the last 168 hours. No results for input(s): "LIPASE", "AMYLASE" in the last 168 hours. No results for input(s): "AMMONIA" in the last 168 hours. CBC: Recent Labs  Lab 06/13/22 2347 06/14/22 0520  WBC 6.6 5.7  HGB 17.3* 15.9  HCT 50.3 46.9  MCV 90.5 93.1  PLT 231 211   Cardiac Enzymes: No results for input(s): "CKTOTAL", "CKMB", "CKMBINDEX", "TROPONINI" in the last 168 hours. BNP: BNP (last 3 results) Recent Labs    06/13/22 2347  BNP 41.0    ProBNP (last 3 results) No results for input(s): "PROBNP" in the last 8760 hours.  CBG: No results for input(s): "GLUCAP" in the last 168 hours.     Signed:  Irine Seal MD.  [facility] 06/14/2022, 3:24 PM

## 2022-06-14 NOTE — Care Management CC44 (Signed)
Condition Code 44 Documentation Completed  Patient Details  Name: LAQUINCY EASTRIDGE MRN: [id number] Date of Birth: 03/04/1956   Condition Code 44 given:  Yes Patient signature on Condition Code 44 notice:  Yes Documentation of 2 MD's agreement:  Yes Code 44 added to claim:  Yes    Alberteen Sam, LCSW 06/14/2022, 3:33 PM

## 2022-06-14 NOTE — H&P (Signed)
[place]   PATIENT NAME: Mike Vasquez    MR#:  [id number]  DATE OF BIRTH:  [birth year]  DATE OF ADMISSION:  06/13/2022  PRIMARY CARE PHYSICIAN: Pcp, No   Patient is coming from: Home  REQUESTING/REFERRING PHYSICIAN: Ward, Delice Bison, DO  CHIEF COMPLAINT:   Chief Complaint  Patient presents with   Chest Pain    HISTORY OF PRESENT ILLNESS:  Mike Vasquez is a 66 y.o. male with medical history significant for paroxysmal atrial fibrillation status post cardiac ablation in [place], grade 1 diastolic dysfunction, hypertension, coronary artery disease, presented to the emergency room with acute onset of intermittent chest pain for the last month with associated dyspnea and palpitations.  He had a recent 20 pound weight gain.  Tonight at 10:30 PM he started having palpitations with associated chest tightness and radiation to his neck and his jaw.  He admitted to associated dyspnea and dizziness with nausea and diaphoresis without vomiting.  Since his cardiac ablation he has not been on medications for atrial fibrillation.  He had 2 previous unsuccessful cardioversions.  ED Course: Upon presentation to the emergency room BP was 139/110 and heart rate 139 with otherwise normal vital signs.  Labs revealed mild hypokalemia 3.2 and magnesium levels 2.4.  BUN and creatinine were 34/1.44 from previous normal levels.  High sensitive troponin was 34 and BNP 41.  CBC showed hemoconcentration and was otherwise unremarkable.  TSH was 2.578. EKG as reviewed by me : EKG showed atrial fibrillation with rapid ventricular sponsor of 137 with T wave inversion inferiorly and associated inferior and lateral ST segment depression and later on he spontaneously converted to normal sinus rhythm with a rate of 73 with sinus pause and prolonged PR interval and suspected left atrial enlargement Imaging: Portable chest x-ray showed mild bibasilar atelectasis.  The patient was given 40 aspirin, 4 mg of IV Zofran and  40 mill Cabbell p.o. potassium chloride.  He will be admitted to a progressive unit bed for further evaluation and management. PAST MEDICAL HISTORY:   Past Medical History:  Diagnosis Date   Dysrhythmia    Fatty liver    history   Lower extremity edema    Mass of thyroid region    Neuropathy     PAST SURGICAL HISTORY:   Past Surgical History:  Procedure Laterality Date   A FLUTTER ABLATION     CARDIAC CATHETERIZATION     HEMORROIDECTOMY     HERNIA REPAIR     SALIVARY GLAND SURGERY     THYROIDECTOMY Bilateral 10/31/2019   Procedure: THYROIDECTOMY;  Surgeon: Margaretha Sheffield, MD;  Location: [facility] ORS;  Service: ENT;  Laterality: Bilateral;   TONSILLECTOMY      SOCIAL HISTORY:   Social History   Tobacco Use   Smoking status: Former    Types: Cigarettes    Quit date: 10/27/2012    Years since quitting: 9.6   Smokeless tobacco: Never  Substance Use Topics   Alcohol use: Never    FAMILY HISTORY:  History reviewed. No pertinent family history.  DRUG ALLERGIES:   Allergies  Allergen Reactions   Lisinopril     cough   Statins     Joint pain    REVIEW OF SYSTEMS:   ROS As per history of present illness. All pertinent systems were reviewed above. Constitutional, HEENT, cardiovascular, respiratory, GI, GU, musculoskeletal, neuro, psychiatric, endocrine, integumentary and hematologic systems were reviewed and are otherwise negative/unremarkable except for positive findings mentioned above  in the HPI.   MEDICATIONS AT HOME:   Prior to Admission medications   Medication Sig Start Date End Date Taking? Authorizing Provider  albuterol (VENTOLIN HFA) 108 (90 Base) MCG/ACT inhaler Inhale into the lungs every 6 (six) hours as needed for wheezing or shortness of breath.    [provider]  b complex vitamins tablet Take 1 tablet by mouth daily.    [provider]  bumetanide (BUMEX) 1 MG tablet Take 1 mg by mouth daily.    [provider]  celecoxib  (CELEBREX) 200 MG capsule Take 200 mg by mouth 2 (two) times daily.    [provider]  Cholecalciferol (VITAMIN D) 125 MCG (5000 UT) CAPS Take 5,000 Units by mouth daily.    [provider]  fenofibrate 160 MG tablet Take 160 mg by mouth daily.    [provider]  folic acid (FOLVITE) 811 MCG tablet Take 400 mcg by mouth daily.    [provider]  gabapentin (NEURONTIN) 300 MG capsule Take 300 mg by mouth 3 (three) times daily.    [provider]  testosterone cypionate (DEPOTESTOSTERONE CYPIONATE) 200 MG/ML injection Inject 100 mg into the muscle every 7 (seven) days. 10/08/19   [provider]  tiZANidine (ZANAFLEX) 4 MG tablet Take 4 mg by mouth 2 (two) times daily as needed for muscle spasms.    [provider]  zinc gluconate 50 MG tablet Take 50 mg by mouth daily.    [provider]      VITAL SIGNS:  Blood pressure 116/63, pulse 70, temperature 98.2 F (36.8 C), resp. rate 20, height '6\' 2"'$  (1.88 m), weight 113.4 kg, SpO2 96 %.  PHYSICAL EXAMINATION:  Physical Exam  GENERAL:  66 y.o.-year-old patient lying in the bed with no acute distress.  EYES: Pupils equal, round, reactive to light and accommodation. No scleral icterus. Extraocular muscles intact.  HEENT: Head atraumatic, normocephalic. Oropharynx and nasopharynx clear.  NECK:  Supple, no jugular venous distention. No thyroid enlargement, no tenderness.  LUNGS: Normal breath sounds bilaterally, no wheezing, rales,rhonchi or crepitation. No use of accessory muscles of respiration.  CARDIOVASCULAR: Regular rate and rhythm, S1, S2 normal. No murmurs, rubs, or gallops.  ABDOMEN: Soft, nondistended, nontender. Bowel sounds present. No organomegaly or mass.  EXTREMITIES: No pedal edema, cyanosis, or clubbing.  NEUROLOGIC: Cranial nerves II through XII are intact. Muscle strength 5/5 in all extremities. Sensation intact. Gait not checked.  PSYCHIATRIC: The patient  is alert and oriented x 3.  Normal affect and good eye contact. SKIN: No obvious rash, lesion, or ulcer.   LABORATORY PANEL:   CBC Recent Labs  Lab 06/13/22 2347  WBC 6.6  HGB 17.3*  HCT 50.3  PLT 231   ------------------------------------------------------------------------------------------------------------------  Chemistries  Recent Labs  Lab 06/13/22 2347 06/14/22 0008  NA 137  --   K 3.2*  --   CL 106  --   CO2 22  --   GLUCOSE 137*  --   BUN 34*  --   CREATININE 1.44*  --   CALCIUM 9.5  --   MG  --  2.4   ------------------------------------------------------------------------------------------------------------------  Cardiac Enzymes No results for input(s): "TROPONINI" in the last 168 hours. ------------------------------------------------------------------------------------------------------------------  RADIOLOGY:  DG Chest Port 1 View  Result Date: 06/14/2022 CLINICAL DATA:  Chest pain. EXAM: PORTABLE CHEST 1 VIEW COMPARISON:  06/15/2014 FINDINGS: The cardiomediastinal contours are normal. Minor atelectasis at the lung bases. Pulmonary vasculature is normal. No consolidation, pleural  effusion, or pneumothorax. No acute osseous abnormalities are seen. IMPRESSION: Minor bibasilar atelectasis. Electronically Signed   By: Keith Rake M.D.   On: 06/14/2022 00:03      IMPRESSION AND PLAN:  Assessment and Plan: * Atrial fibrillation with RVR ([place]) - This is clearly paroxysmal atrial fibrillation with RVR. - The patient will be admitted to a PCU bed. - We will monitor for recurrent atrial fibrillation. - We will aggressively replace potassium.  Magnesium levels normal. - Cardiology consult and 2D echo will be obtained. - I notified Dr. Clayborn Bigness about the patient.  Hypokalemia - Potassium will be aggressively replaced as this could be contributing to her paroxysms of atrial fibrillation. - Magnesium levels normal  Essential hypertension - We will  continue his antihypertensives.  Hypothyroidism - We will continue Synthroid.  Dyslipidemia - We will continue fenofibrate and statin therapy.  Chronic diastolic CHF (congestive heart failure) (HCC) - The patient does not seem to be in acute on chronic CHF. - We will continue her diuretic therapy.    DVT prophylaxis: Lovenox. Advanced Care Planning:  Code Status: full code.  Family Communication:  The plan of care was discussed in details with the patient (and family). I answered all questions. The patient agreed to proceed with the above mentioned plan. Further management will depend upon hospital course. Disposition Plan: Back to previous home environment Consults called: Cardiology.   All the records are reviewed and case discussed with ED provider.  Status is: Inpatient   At the time of the admission, it appears that the appropriate admission status for this patient is inpatient.  This is judged to be reasonable and necessary in order to provide the required intensity of service to ensure the patient's safety given the presenting symptoms, physical exam findings and initial radiographic and laboratory data in the context of comorbid conditions.  The patient requires inpatient status due to high intensity of service, high risk of further deterioration and high frequency of surveillance required.  I certify that at the time of admission, it is my clinical judgment that the patient will require inpatient hospital care extending more than 2 midnights.                            Dispo: The patient is from: Home              Anticipated d/c is to: Home              Patient currently is not medically stable to d/c.              Difficult to place patient: No  Christel Mormon M.D on 06/14/2022 at 2:48 AM  [facility]   From 7 PM-7 AM, contact night-coverage [email or web address]  CC: Primary care physician; Pcp, No

## 2022-06-14 NOTE — Consult Note (Addendum)
[place] NOTE       Patient ID: Mike Vasquez MRN: [id number] DOB/AGE: 1956-01-15 66 y.o.  Admit date: 06/13/2022 Referring Physician Dr. Irine Seal Primary Physician PCP in [facility] [place], [place]) Reason for Consultation paroxysmal AF with RVR  HPI: Mike Vasquez is a 60yoM with a PMH of paroxysmal atrial flutter s/p ablation 08/2018 after two unsuccessful DCCV, ASD/PFO with bidirectional shunting discovered on R heart cath 2016 treated medically, mild-moderate CAD by heart cath 04/2014, history of tobacco use, hyperlipidemia, hypothyroidism s/p thyroidectomy who presented to [facility] ED 06/13/2022 with chest aching and fluttering with discomfort radiating to his neck and jaw that started at 10:30 PM on 7/18.  He was found to be in atrial fibrillation with RVR on admission, converting to sinus rhythm spontaneously.  Cardiology is consulted for further assistance.  The patient presents with his wife who contributes to the history.  He and his wife are retired and live primarily an [place] which is currently being repaired in [place] where they used to live.  Their current home base is in [place] but they spend almost equal time here in [place] visiting family.  He states over the past 2 months he has noticed a 20 pound weight gain which surprised him after he was weighed at his PCPs office. He has not been weighing daily because his scale is in his RV. he has peripheral edema and possible diastolic heart failure for which  which he will take Bumex 1 mg twice a day if he noticed swelling in his legs.  He was surprised by his weight gain because his legs were not swollen, but noticed some generalized swelling in his abdomen, arms and face.  He has taken his Bumex 1 mg twice daily for the past 10 days and his weight has decreased 10 pounds.  He has not been seen by a cardiologist in person in 4 to 5 years but has had some contact with his  regular cardiologist in [place] via telephone.  He says he has been stable from an arrhythmia standpoint to his knowledge over the past few years and has been off of his antiarrhythmic, anticoagulation, and rate control medicines for a number of years as well.  He says on June 6 he received a notification overnight from his [employer] that he have a short episode of arrhythmia that he did not notice.  And yesterday around 1030pm he had a sudden onset of chest "vibrating" and achiness that radiated to his neck ,jaw, and ears that lasted until he presented to the ER and converted to sinus rhythm spontaneously.  At my time of evaluation he is calm and comfortable, in sinus rhythm on telemetry, has some residual headache, but no "vibrating" in his chest or achiness.  No orthopnea, shortness of breath, presyncope, or lower extremity edema.  He has a 30-40-pack-year history of smoking, quit 10 years ago.  Vitals are notable for a blood pressure of 166/61, heart rate 64 in sinus rhythm on telemetry, SPO2 96% on room air.  His labs are notable for initially hypokalemia to 3.2 repleted to 5.1, magnesium 2.6 BUN/creatinine 34/1.31, GFR 60.  BNP 41 high-sensitivity troponin minimally elevated and flat trending 34-51-48.  H&H stable at 15.9/46.9, platelets 211.  Chest x-ray with no pleural effusion, pneumothorax with minor bibasilar atelectasis.   Review of systems complete and found to be negative unless listed above     Past Medical History:  Diagnosis Date  Dysrhythmia    Fatty liver    history   Lower extremity edema    Mass of thyroid region    Neuropathy     Past Surgical History:  Procedure Laterality Date   A FLUTTER ABLATION     CARDIAC CATHETERIZATION     HEMORROIDECTOMY     HERNIA REPAIR     SALIVARY GLAND SURGERY     THYROIDECTOMY Bilateral 10/31/2019   Procedure: THYROIDECTOMY;  Surgeon: Margaretha Sheffield, MD;  Location: [facility] ORS;  Service: ENT;  Laterality: Bilateral;   TONSILLECTOMY       (Not in a hospital admission)  Social History   Socioeconomic History   Marital status: Married    Spouse name: Not on file   Number of children: Not on file   Years of education: Not on file   Highest education level: Not on file  Occupational History   Not on file  Tobacco Use   Smoking status: Former    Types: Cigarettes    Quit date: 10/27/2012    Years since quitting: 9.6   Smokeless tobacco: Never  Vaping Use   Vaping Use: Never used  Substance and Sexual Activity   Alcohol use: Never   Drug use: Never   Sexual activity: Not on file  Other Topics Concern   Not on file  Social History Narrative   Not on file   Social Determinants of Health   Financial Resource Strain: Not on file  Food Insecurity: Not on file  Transportation Needs: Not on file  Physical Activity: Not on file  Stress: Not on file  Social Connections: Not on file  Intimate Partner Violence: Not on file    History reviewed. No pertinent family history.    PHYSICAL EXAM General: Pleasant conversational Caucasian male, well nourished, in no acute distress.  Sitting upright in ED stretcher with wife at bedside HEENT:  Normocephalic and atraumatic. Neck:  No JVD.  Lungs: Normal respiratory effort on room air. Clear bilaterally to auscultation. No wheezes, crackles, rhonchi.  Heart: HRRR . Normal S1 and S2 without gallops or murmurs. Radial & DP pulses 2+ bilaterally. Abdomen: Obese appearing.  Msk: Normal strength and tone for age. Extremities: Warm and well perfused. No clubbing, cyanosis.  No peripheral edema.  Neuro: Alert and oriented X 3. Psych:  Answers questions appropriately.   Labs:   Lab Results  Component Value Date   WBC 5.7 06/14/2022   HGB 15.9 06/14/2022   HCT 46.9 06/14/2022   MCV 93.1 06/14/2022   PLT 211 06/14/2022    Recent Labs  Lab 06/14/22 0520  NA 141  K 5.1  CL 109  CO2 25  BUN 34*  CREATININE 1.31*  CALCIUM 9.0  GLUCOSE 85   Lab Results  Component  Value Date   CKTOTAL 95 06/15/2014   CKMB 0.8 06/15/2014   TROPONINI 0.02 06/15/2014   No results found for: "CHOL" No results found for: "HDL" No results found for: "LDLCALC" No results found for: "TRIG" No results found for: "CHOLHDL" No results found for: "LDLDIRECT"    Radiology: [facility] Chest Port 1 View  Result Date: 06/14/2022 CLINICAL DATA:  Chest pain. EXAM: PORTABLE CHEST 1 VIEW COMPARISON:  06/15/2014 FINDINGS: The cardiomediastinal contours are normal. Minor atelectasis at the lung bases. Pulmonary vasculature is normal. No consolidation, pleural effusion, or pneumothorax. No acute osseous abnormalities are seen. IMPRESSION: Minor bibasilar atelectasis. Electronically Signed   By: Keith Rake M.D.   On: 06/14/2022 00:03  ECHO 07/09/2018 Summary: TEE with [facility]  The left ventricular wall motion is normal.  No left atrial clot detected.  There is trace mitral regurgitation.   Reason for Study: I48.0 Paroxysmal atrial fibrillation ([place]).  Procedure: 48546 Complete color flow Doppler interrogation.  Transesophageal echocardiography diagnostic (27035).  Procedure Notes: After explanation of the risk and benefits, patient  consented for Transesophageal Echocardiogram. An intravenous line was  placed. A topical anesthetic agent was used for oropharangeal  anesthesia. A bite block was inserted. A multifrequency, multiplane  transesopheageal echocardiographic endoscope was inserted and  manipulated in the standard fashion to achieve multiplane views. The  usual views were obtained; basal, mid-esophageal, transgastric and  aortic views. The patient's vital signs, including blood pressure,  heart rate, pulse oximetry and cardiac rhythm were monitored  throughout the procedure and remained stable. The patient tolerated  the procedure well without evidence of orophangeal or esophageal  trauma. There were no complications.  Left Ventricle: There is normal left ventricular wall  thickness. The  left ventricular wall motion is normal.  Right Ventricle: The right ventricle is normal in size and function.  The right ventricular systolic function is normal.  Left Atrium: The left atrial size is normal. No left atrial clot  detected.  Right Atrium: Right atrial size is normal.  Mitral Valve: The mitral valve leaflets appear normal. There is no  evidence of stenosis, fluttering, or prolapse. There is no evidence  of mitral valve prolapse. There is trace mitral regurgitation.  Aortic Valve: The aortic valve is normal in structure and function.  No aortic regurgitation is present.  Aortic Root: The aortic root is normal size.  Tricuspid Valve: The tricuspid valve is normal in structure and  function. Trace tricuspid regurgitation is present.  Pulmonary valve: The pulmonic valve is normal in structure. There is  trace pulmonic valvular regurgitation.  Pericardium: There is no pericardial effusion.   Signed by:MD Harrison Mons, MD on 07/09/2018 03:48 PM  Referring Physician: Ashley Mariner  Performed By: Carie Caddy, RCS  TELEMETRY reviewed by me: Sinus bradycardia to sinus rhythm with rates between 52-60s  EKG reviewed by me: Atrial fibrillation versus flutter with RVR rate 137, converting to sinus rhythm with first-degree AV block on repeat at 73 bpm with a sinus pause  ASSESSMENT AND PLAN:  Mike Vasquez is a 77yoM with a PMH of paroxysmal atrial flutter s/p ablation 08/2018 after two unsuccessful DCCV, ASD/PFO with bidirectional shunting discovered on R heart cath 2016 treated medically, mild-moderate CAD by heart cath 04/2014, history of tobacco use, hyperlipidemia, hypothyroidism s/p thyroidectomy who presented to [facility] ED 06/13/2022 with chest aching and fluttering with discomfort radiating to his neck and jaw that started at 10:30 PM on 7/18.  He was found to be in atrial fibrillation with RVR on admission, converting to sinus rhythm spontaneously.  Cardiology is  consulted for further assistance.  #Paroxysmal atrial flutter/fib with RVR, spontaneously converting to sinus rhythm #History of atrial flutter ablation 2019 #Elevated troponin The patient has an extensive prior history of failed DCCV's for atrial flutter, ultimately undergoing an ablation in 2019 and has been doing well from an arrhythmia standpoint since then to his knowledge.  In the setting of dietary indiscretions, a 20 pound weight gain could have been the trigger for this recurrence.  He took Bumex 1 mg twice daily for the past 10 days and he says he is lost 10 pounds as a result and feels like he is nearing his dry weight.  His BNP is not elevated and labs show that he is pretty dry.  He was off of antiarrhythmics, beta-blockers, and Xarelto after his ablation and has been doing well. Discussed at length with the patient and his wife strategies moving forward, including restarting Xarelto 20 mg once daily for stroke risk reduction, and taking as needed metoprolol tartrate 25 mg for sustained heart rates, in addition to limiting his salt intake to less than 1500 mg daily, and fluids to 2 L/day.   -Troponin minimally elevated with flat trend, likely demand in the setting of tachycardia, he has denied further chest aching since he has been in sinus rhythm.   -Restart Xarelto 20 mg once daily for stroke risk reduction CHA2DS2-VASc is 2  -As needed metoprolol tartrate 25 mg  -Continue watching his weight and take Bumex 1 mg twice daily for a weight gain of greater than 5 pounds in 2 days. -He is okay for discharge today from a cardiac standpoint without further diagnostics. -He plans to establish care with Dr. Clayborn Bigness, we will set him up with an appointment in 1 to 2 weeks for an echo and consideration of ambulatory monitoring   This patient's plan of care was discussed and created with Dr. Clayborn Bigness and he is in agreement.  Signed: Tristan Schroeder , PA-C 06/14/2022, 11:38 AM [facility]  Cardiology

## 2022-06-14 NOTE — Assessment & Plan Note (Addendum)
-   Patient maintained on home regimen of antihypertensive medication.

## 2022-06-14 NOTE — Assessment & Plan Note (Addendum)
-   Patient maintained on home regimen fenofibrate and statin therapy.

## 2022-06-14 NOTE — ED Provider Notes (Signed)
[facility] Provider Note    Event Date/Time   First MD Initiated Contact with Patient 06/13/22 2357     (approximate)   History   Chest Pain   HPI  ULYESS MUTO is a 66 y.o. male with history of grade 1 diastolic dysfunction, hypertension, CAD, paroxysmal atrial flutter status post ablation in [place] who presents to the emergency department with his wife for concerns of 1 month of intermittent chest pain, shortness of breath, palpitations, 20 pound weight gain.  States tonight at 10:30 PM he started to feel like his heart was racing and he felt tightness in his chest that went into his neck and into his jaw.  He has shortness of breath, dizziness, nausea and diaphoresis.  He is no longer on any medications for his A-fib including anticoagulation since his ablation.  States he has had 2 cardioversions previously that were unsuccessful.  History provided by patient and wife.    Past Medical History:  Diagnosis Date   Dysrhythmia    Fatty liver    history   Lower extremity edema    Mass of thyroid region    Neuropathy    Left ventricular diastolic dysfunction, NYHA class 1 - Primary    CAD in native artery   Coronary atherosclerosis of native coronary artery    Morbid obesity (HCC)   Morbid obesity    Tobacco use disorder    Paroxysmal atrial flutter (HCC)   Atrial flutter    Essential hypertension   Unspecified essential hypertension    PFO (patent foramen ovale)   Ostium secundum type atrial septal defect    Pulmonary HTN ([place])   Other chronic pulmonary heart diseases      Past Surgical History:  Procedure Laterality Date   A FLUTTER ABLATION     CARDIAC CATHETERIZATION     HEMORROIDECTOMY     HERNIA REPAIR     SALIVARY GLAND SURGERY     THYROIDECTOMY Bilateral 10/31/2019   Procedure: THYROIDECTOMY;  Surgeon: Margaretha Sheffield, MD;  Location: [facility] ORS;  Service: ENT;  Laterality: Bilateral;   TONSILLECTOMY      MEDICATIONS:  Prior to  Admission medications   Medication Sig Start Date End Date Taking? Authorizing Provider  albuterol (VENTOLIN HFA) 108 (90 Base) MCG/ACT inhaler Inhale into the lungs every 6 (six) hours as needed for wheezing or shortness of breath.    [provider]  b complex vitamins tablet Take 1 tablet by mouth daily.    [provider]  bumetanide (BUMEX) 1 MG tablet Take 1 mg by mouth daily.    [provider]  celecoxib (CELEBREX) 200 MG capsule Take 200 mg by mouth 2 (two) times daily.    [provider]  Cholecalciferol (VITAMIN D) 125 MCG (5000 UT) CAPS Take 5,000 Units by mouth daily.    [provider]  fenofibrate 160 MG tablet Take 160 mg by mouth daily.    [provider]  folic acid (FOLVITE) 672 MCG tablet Take 400 mcg by mouth daily.    [provider]  gabapentin (NEURONTIN) 300 MG capsule Take 300 mg by mouth 3 (three) times daily.    [provider]  testosterone cypionate (DEPOTESTOSTERONE CYPIONATE) 200 MG/ML injection Inject 100 mg into the muscle every 7 (seven) days. 10/08/19   [provider]  tiZANidine (ZANAFLEX) 4 MG tablet Take 4 mg by mouth 2 (two) times daily as needed for muscle spasms.    [provider]  zinc gluconate 50 MG tablet Take 50 mg by mouth daily.    [provider]    Physical Exam   Triage Vital Signs: ED Triage Vitals  Enc Vitals Group     BP 06/13/22 2344 (!) 139/110     Pulse Rate 06/13/22 2344 (!) 139     Resp 06/13/22 2344 20     Temp 06/13/22 2344 98.2 F (36.8 C)     Temp src --      SpO2 06/13/22 2344 98 %     Weight 06/13/22 2340 250 lb (113.4 kg)     Height 06/13/22 2340 '6\' 2"'$  (1.88 m)     Head Circumference --      Peak Flow --      Pain Score 06/13/22 2340 6     Pain Loc --      Pain Edu? --      Excl. in Portal? --     Most recent vital signs: Vitals:   06/14/22 0030 06/14/22 0100  BP: 107/70 109/65  Pulse: 78 71  Resp: 18 16  Temp:     SpO2: 92% 90%    CONSTITUTIONAL: Alert and oriented and responds appropriately to questions.  Appears uncomfortable but not in distress HEAD: Normocephalic, atraumatic EYES: Conjunctivae clear, pupils appear equal, sclera nonicteric ENT: normal nose; moist mucous membranes NECK: Supple, normal ROM CARD: Irregularly irregular and tachycardic; S1 and S2 appreciated; no murmurs, no clicks, no rubs, no gallops RESP: Normal chest excursion without splinting or tachypnea; breath sounds clear and equal bilaterally; no wheezes, no rhonchi, no rales, no hypoxia or respiratory distress, speaking full sentences ABD/GI: Normal bowel sounds; non-distended; soft, non-tender, no rebound, no guarding, no peritoneal signs BACK: The back appears normal EXT: Normal ROM in all joints; no deformity noted, no edema; no cyanosis, no calf tenderness or calf swelling SKIN: Normal color for age and race; warm; no rash on exposed skin NEURO: Moves all extremities equally, normal speech PSYCH: The patient's mood and manner are appropriate.   ED Results / Procedures / Treatments   LABS: (all labs ordered are listed, but only abnormal results are displayed) Labs Reviewed  BASIC METABOLIC PANEL - Abnormal; Notable for the following components:      Result Value   Potassium 3.2 (*)    Glucose, Bld 137 (*)    BUN 34 (*)    Creatinine, Ser 1.44 (*)    GFR, Estimated 53 (*)    All other components within normal limits  CBC - Abnormal; Notable for the following components:   Hemoglobin 17.3 (*)    All other components within normal limits  TROPONIN I (HIGH SENSITIVITY) - Abnormal; Notable for the following components:   Troponin I (High Sensitivity) 34 (*)    All other components within normal limits  MAGNESIUM  BRAIN NATRIURETIC PEPTIDE  TSH  TROPONIN I (HIGH SENSITIVITY)     EKG:  EKG Interpretation  Date/Time:  Tuesday June 13 2022 23:40:51 EDT Ventricular Rate:  137 PR Interval:    QRS  Duration: 86 QT Interval:  324 QTC Calculation: 489 R Axis:   70 Text Interpretation: Atrial fibrillation with rapid ventricular response Nonspecific ST abnormality Abnormal ECG When compared with ECG of 28-Oct-2019 13:44, Atrial fibrillation has replaced Sinus rhythm Vent. rate has increased BY  89 BPM ST now depressed in Inferior leads ST now depressed in Anterolateral leads Nonspecific T wave abnormality no longer evident in Lateral leads Confirmed by Azad Calame, Cyril Mourning [phone])  on 06/14/2022 12:00:43 AM        EKG Interpretation  Date/Time:  Wednesday June 14 2022 00:50:24 EDT Ventricular Rate:  73 PR Interval:  228 QRS Duration: 93 QT Interval:  389 QTC Calculation: 429 R Axis:   18 Text Interpretation: Sinus rhythm Sinus pause Prolonged PR interval Consider left atrial enlargement Confirmed by Pryor Curia [phone]) on 06/14/2022 1:44:45 AM         RADIOLOGY: My personal review and interpretation of imaging: Chest x-ray clear.  I have personally reviewed all radiology reports.   DG Chest Port 1 View  Result Date: 06/14/2022 CLINICAL DATA:  Chest pain. EXAM: PORTABLE CHEST 1 VIEW COMPARISON:  06/15/2014 FINDINGS: The cardiomediastinal contours are normal. Minor atelectasis at the lung bases. Pulmonary vasculature is normal. No consolidation, pleural effusion, or pneumothorax. No acute osseous abnormalities are seen. IMPRESSION: Minor bibasilar atelectasis. Electronically Signed   By: Keith Rake M.D.   On: 06/14/2022 00:03     PROCEDURES:  Critical Care performed: Yes, see critical care procedure note(s)   CRITICAL CARE Performed by: Cyril Mourning Brier Reid   Total critical care time: 45 minutes  Critical care time was exclusive of separately billable procedures and treating other patients.  Critical care was necessary to treat or prevent imminent or life-threatening deterioration.  Critical care was time spent personally by me on the following activities: development of  treatment plan with patient and/or surrogate as well as nursing, discussions with consultants, evaluation of patient's response to treatment, examination of patient, obtaining history from patient or surrogate, ordering and performing treatments and interventions, ordering and review of laboratory studies, ordering and review of radiographic studies, pulse oximetry and re-evaluation of patient's condition.   Marland Kitchen1-3 Lead EKG Interpretation  Performed by: Aiyonna Lucado, Delice Bison, DO Authorized by: Melek Pownall, Delice Bison, DO     Interpretation: abnormal     ECG rate:  137   ECG rate assessment: tachycardic     Rhythm: atrial fibrillation     Ectopy: none     Conduction: normal       IMPRESSION / MDM / ASSESSMENT AND PLAN / ED COURSE  I reviewed the triage vital signs and the nursing notes.    Patient here with chest pain, shortness of breath, nausea, diaphoresis, lightheadedness and found to be in A-fib with RVR.  History of the same and that required ablation in 2020 in [place].    The patient is on the cardiac monitor to evaluate for evidence of arrhythmia and/or significant heart rate changes.   DIFFERENTIAL DIAGNOSIS (includes but not limited to):   A-fib with RVR, ACS, PE, dissection, anemia, electrolyte derangement, thyroid dysfunction, CHF exacerbation   Patient's presentation is most consistent with acute presentation with potential threat to life or bodily function.   PLAN: We will obtain CBC, BMP, magnesium level, TSH, troponin, BNP, chest x-ray.  Will start diltiazem.  Will give Zofran, aspirin, nitroglycerin.   MEDICATIONS GIVEN IN ED: Medications  nitroGLYCERIN (NITROSTAT) SL tablet 0.4 mg (has no administration in time range)  ondansetron (ZOFRAN) injection 4 mg (4 mg Intravenous Given 06/14/22 0122)  aspirin chewable tablet 324 mg (324 mg Oral Given 06/14/22 0122)  potassium chloride SA (KLOR-CON M) CR tablet 40 mEq (40 mEq Oral Given 06/14/22 0121)     ED COURSE: Patient's labs  show potassium level of 3.2.  Will give replacement.  Creatinine of 1.44 with no recent for comparison.  Normal hemoglobin.  BNP is normal.  First troponin slightly elevated at 34  which may be due to demand ischemia due to elevated heart rate.  Magnesium level normal.  TSH normal.  Chest x-ray reviewed/interpreted by myself radiology and shows no edema.  Patient converted into a sinus rhythm prior to receiving diltiazem but still having tightness in his neck and jaw.  Have offered admission for observation for cardiac monitoring, serial troponins.  Patient and wife would prefer this.  Will discuss with hospitalist.   CONSULTS:  Consulted and discussed patient's case with hospitalist, Dr. Sidney Ace.  I have recommended admission and consulting physician agrees and will place admission orders.  Patient (and family if present) agree with this plan.   I reviewed all nursing notes, vitals, pertinent previous records.  All labs, EKGs, imaging ordered have been independently reviewed and interpreted by myself.    OUTSIDE RECORDS REVIEWED: Reviewed patient's cardiology notes with [facility] in 2020.       FINAL CLINICAL IMPRESSION(S) / ED DIAGNOSES   Final diagnoses:  Nonspecific chest pain  Atrial fibrillation with RVR ([place])     Rx / DC Orders   ED Discharge Orders     None        Note:  This document was prepared using Dragon voice recognition software and may include unintentional dictation errors.   Ahaan Zobrist, Delice Bison, DO 06/14/22 [phone]

## 2022-06-14 NOTE — Assessment & Plan Note (Addendum)
-   Repleted aggressively.   -Magnesium noted within normal limits.   -Hypokalemia had resolved by day of discharge.

## 2022-06-14 NOTE — Assessment & Plan Note (Addendum)
-   TSH noted within normal limits.   -Patient maintained on home regimen Synthroid.

## 2022-06-14 NOTE — Assessment & Plan Note (Addendum)
-   This is clearly paroxysmal atrial fibrillation with RVR. - Patient admitted with A-fib with RVR and spontaneously converted to normal sinus rhythm.  -Patient monitored in the ED for recurrent A-fib.  -Electrolytes were aggressively repleted.  -Magnesium level noted normal.  -TSH noted within normal limits at 2.578.  -Cardiac enzymes minimally elevated but flat and felt likely secondary to demand ischemia in the setting of tachycardia per cardiology. -Cardiology consulted who assessed the patient and recommended patient be resumed on Xarelto 20 mg daily for stroke risk reduction and also recommended patient on metoprolol 25 mg as needed for sustained heart rates > 150, -Close outpatient follow-up with Dr. Clayborn Bigness in 1 to 2 weeks for echo and consideration for ambulatory monitoring. -Patient was cleared by cardiology for discharge home, patient also anxious to be discharged and as such patient be discharged home in stable and improved condition.  - Outpatient follow-up with cardiology.

## 2022-06-14 NOTE — Assessment & Plan Note (Addendum)
-   The patient does not seem to be in acute on chronic CHF. - Patient maintained on home regimen Bumex.  -Outpatient follow-up with cardiology.

## 2022-06-14 NOTE — Progress Notes (Signed)
  Transition of Care [facility]) Screening Note   Patient Details  Name: Mike Vasquez Date of Birth: Sep 16, 1956   Transition of Care [facility] CM/SW Contact:    Alberteen Sam, LCSW Phone Number: 06/14/2022, 3:32 PM    Transition of Care Department [facility]) has reviewed patient and no TOC needs have been identified at this time. We will continue to monitor patient advancement through interdisciplinary progression rounds. If new patient transition needs arise, please place a TOC consult.  [place], [place]

## 2022-06-14 NOTE — Care Management (Signed)
toc
